# Patient Record
Sex: Female | Born: 1955 | Race: White | Hispanic: No | Marital: Married | Smoking: Never smoker
Health system: Southern US, Community
[De-identification: ages and names within clinical notes are randomized; demographics above are authoritative.]

## PROBLEM LIST (undated history)

## (undated) DIAGNOSIS — I10 Essential (primary) hypertension: Secondary | ICD-10-CM

## (undated) DIAGNOSIS — K219 Gastro-esophageal reflux disease without esophagitis: Secondary | ICD-10-CM

## (undated) DIAGNOSIS — Z789 Other specified health status: Secondary | ICD-10-CM

## (undated) HISTORY — PX: TONSILLECTOMY: SUR1361

## (undated) HISTORY — DX: Essential (primary) hypertension: I10

## (undated) HISTORY — PX: APPENDECTOMY: SHX54

---

## 1975-11-18 HISTORY — PX: WRIST MASS EXCISION: SHX2674

## 1980-11-17 HISTORY — PX: ECTOPIC PREGNANCY SURGERY: SHX613

## 2006-08-30 ENCOUNTER — Emergency Department (HOSPITAL_COMMUNITY): Admission: EM | Admit: 2006-08-30 | Discharge: 2006-08-30 | Payer: Self-pay | Admitting: Family Medicine

## 2011-08-28 NOTE — H&P (Signed)
NTS SOAP Note  Vital Signs:  Vitals as of: 08/28/2011: Systolic 148: Diastolic 93: Heart Rate 70: Temp 96.48F: Height 35ft 5in: Weight 184Lbs 0 Ounces: Pain Level 3: BMI 31  BMI : 30.62 kg/m2  Subjective: This 55 Years 2 Months old Female presents for of ABDOMINAL ISSUES: ,Has been having intermittent right upper quadrant abdominal pain, nausea, vomiting, and food intolerance for over a year. Seems to be getting more frequent. U/S of gallbladder revealed cholelithiasis. No fever, chills, jaundice.  Review of Symptoms:  Constitutional: unremarkable  Head: unremarkable  Eyes:unremarkable  Nose/Mouth/Throat:unremarkable  Cardiovascular:unremarkable  Respiratory: unremarkable  Gastrointestin abdominal pain,nausea,vomiting,heartburn Genitourinary: unremarkable  Musculoskeletal: unremarkable  Skin: unremarkable  Hematolgic/Lymphatic: unremarkable  Allergic/Immunologic:unremarkable    Past Medical History:Reviewed  Past Medical History  Surgical History: Appendectomy, salpingectomy Medical Problems: unremakable Allergies: nkda Medications: ambien   Social History: Reviewed   Social History  Preferred Language: English ([place]) Race: White Ethnicity: Not Hispanic / Latino Age: 4 Years 2 Months Alcohol: Yes, socially Recreational drug(s): No   Smoking Status: Never smoker reviewed on 08/28/2011  Family History: Reviewed  Family History  Father: Cancer, Hypertension Mother: Cancer, Hypertension, Diabetes Type II Sibling: Cancer, Hypertension, Diabetes Type II    Objective Information:  General: Well appearing, well nourished in no distress.  Skin:no rash or prominent lesions  Head: Atraumatic; no masses; no abnormalities  No scleral icterus Neck: Supple without lymphadenopathy.  Heart: RRR, no murmur or gallop. Normal S1, S2. No S3, S4.  Lungs:CTA bilaterally, no wheezes, rhonchi, rales. Breathing unlabored.  Abdomen: Soft, ND, normal bowel sounds,  no HSM, no masses. No peritoneal signs. Tender in right upper quadrant to palpation.   Assessment: biliary colic, cholelithiasis  Diagnosis & Procedure: DiagnosisCode: 574.10, ProcedureCode: 16109,    Plan:Scheduled for laparoscopic cholecystectomy on 09/03/11.   Patient Education: Alternative treatments to surgery were discussed with patient (and family).Risks and benefits of procedure were fully explained to the patient (and family) who gave informed consent. Patient/family questions were addressed.  Follow-up: Pending Surgery

## 2011-09-01 ENCOUNTER — Encounter (HOSPITAL_COMMUNITY)
Admission: RE | Admit: 2011-09-01 | Discharge: 2011-09-01 | Disposition: A | Discharge status: Home / Self Care | Payer: Managed Care, Other (non HMO) | Source: Ambulatory Visit | Attending: General Surgery | Admitting: General Surgery

## 2011-09-01 ENCOUNTER — Encounter (HOSPITAL_COMMUNITY): Payer: Self-pay

## 2011-09-01 HISTORY — DX: Other specified health status: Z78.9

## 2011-09-01 LAB — SURGICAL PCR SCREEN: Staphylococcus aureus: NEGATIVE

## 2011-09-01 LAB — DIFFERENTIAL
Basophils Absolute: 0 10*3/uL (ref 0.0–0.1)
Basophils Relative: 0 % (ref 0–1)
Lymphocytes Relative: 35 % (ref 12–46)
Monocytes Relative: 8 % (ref 3–12)
Neutro Abs: 4.9 10*3/uL (ref 1.7–7.7)
Neutrophils Relative %: 55 % (ref 43–77)

## 2011-09-01 LAB — HEPATIC FUNCTION PANEL
ALT: 16 U/L (ref 0–35)
AST: 15 U/L (ref 0–37)
Albumin: 3.9 g/dL (ref 3.5–5.2)

## 2011-09-01 LAB — BASIC METABOLIC PANEL
BUN: 12 mg/dL (ref 6–23)
Chloride: 103 mEq/L (ref 96–112)
GFR calc Af Amer: >90 mL/min (ref >90)
GFR calc non Af Amer: >90 mL/min (ref >90)
Potassium: 4.7 mEq/L (ref 3.5–5.1)
Sodium: 139 mEq/L (ref 135–145)

## 2011-09-01 LAB — CBC
Hemoglobin: 13.5 g/dL (ref 12.0–15.0)
MCHC: 32.8 g/dL (ref 30.0–36.0)
WBC: 8.8 10*3/uL (ref 4.0–10.5)

## 2011-09-01 NOTE — Patient Instructions (Signed)
20 NETHA DAFOE  09/01/2011   Your procedure is scheduled on: 09/05/2011  Report to Janet Fields at [phone] AM.  Call this number if you have problems the morning of surgery: [phone]   Remember:   Do not eat food:After Midnight.  Do not drink clear liquids: After Midnight.  Take these medicines the morning of surgery with A SIP OF WATER: no meds   Do not wear jewelry, make-up or nail polish.  Do not wear lotions, powders, or perfumes. You may wear deodorant.  Do not shave 48 hours prior to surgery.  Do not bring valuables to the hospital.  Contacts, dentures or bridgework may not be worn into surgery.  Leave suitcase in the car. After surgery it may be brought to your room.  For patients admitted to the hospital, checkout time is 11:00 AM the day of discharge.   Patients discharged the day of surgery will not be allowed to drive home.  Name and phone number of your driver: spouse  Special Instructions: CHG Shower Use Special Wash: 1/2 bottle night before surgery and 1/2 bottle morning of surgery.   Please read over the following fact sheets that you were given: Pain Booklet and Anesthesia Post-op Instructions

## 2011-09-05 ENCOUNTER — Encounter (HOSPITAL_COMMUNITY)
Admission: RE | Disposition: A | Discharge status: Home / Self Care | Payer: Self-pay | Source: Ambulatory Visit | Attending: General Surgery

## 2011-09-05 ENCOUNTER — Encounter (HOSPITAL_COMMUNITY): Payer: Self-pay | Admitting: *Deleted

## 2011-09-05 ENCOUNTER — Encounter (HOSPITAL_COMMUNITY): Payer: Self-pay | Admitting: Anesthesiology

## 2011-09-05 ENCOUNTER — Ambulatory Visit (HOSPITAL_COMMUNITY): Payer: Managed Care, Other (non HMO) | Admitting: Anesthesiology

## 2011-09-05 ENCOUNTER — Other Ambulatory Visit: Payer: Self-pay | Admitting: General Surgery

## 2011-09-05 ENCOUNTER — Ambulatory Visit (HOSPITAL_COMMUNITY)
Admission: RE | Admit: 2011-09-05 | Discharge: 2011-09-05 | Disposition: A | Discharge status: Home / Self Care | Payer: Managed Care, Other (non HMO) | Source: Ambulatory Visit | Attending: General Surgery | Admitting: General Surgery

## 2011-09-05 DIAGNOSIS — K8010 Calculus of gallbladder with chronic cholecystitis without obstruction: Secondary | ICD-10-CM | POA: Insufficient documentation

## 2011-09-05 DIAGNOSIS — Z01812 Encounter for preprocedural laboratory examination: Secondary | ICD-10-CM | POA: Insufficient documentation

## 2011-09-05 HISTORY — PX: CHOLECYSTECTOMY: SHX55

## 2011-09-05 SURGERY — LAPAROSCOPIC CHOLECYSTECTOMY
Anesthesia: General | Site: Abdomen | Wound class: Contaminated

## 2011-09-05 MED ORDER — BUPIVACAINE HCL 0.5 % IJ SOLN
INTRAMUSCULAR | Status: DC | PRN
  Administered 2011-09-05: 10 mL

## 2011-09-05 MED ORDER — FENTANYL CITRATE 0.05 MG/ML IJ SOLN: 25.0000 ug | INTRAMUSCULAR | Status: DC | PRN

## 2011-09-05 MED ORDER — ACETAMINOPHEN 325 MG PO TABS: 325.0000 mg | ORAL_TABLET | ORAL | Status: DC | PRN

## 2011-09-05 MED ORDER — FENTANYL CITRATE 0.05 MG/ML IJ SOLN
INTRAMUSCULAR | Status: AC
  Filled 2011-09-05: qty 5

## 2011-09-05 MED ORDER — ENOXAPARIN SODIUM 40 MG/0.4ML ~~LOC~~ SOLN
40.0000 mg | Freq: Once | SUBCUTANEOUS | Status: AC
  Administered 2011-09-05: 40 mg via SUBCUTANEOUS

## 2011-09-05 MED ORDER — ONDANSETRON HCL 4 MG/2ML IJ SOLN
4.0000 mg | Freq: Once | INTRAMUSCULAR | Status: AC
  Administered 2011-09-05: 4 mg via INTRAVENOUS

## 2011-09-05 MED ORDER — KETOROLAC TROMETHAMINE 30 MG/ML IJ SOLN
INTRAMUSCULAR | Status: AC
  Filled 2011-09-05: qty 1

## 2011-09-05 MED ORDER — GLYCOPYRROLATE 0.2 MG/ML IJ SOLN
INTRAMUSCULAR | Status: AC
  Filled 2011-09-05: qty 1

## 2011-09-05 MED ORDER — OXYCODONE-ACETAMINOPHEN 7.5-325 MG PO TABS: 1.0000 | ORAL_TABLET | ORAL | Status: AC | PRN

## 2011-09-05 MED ORDER — SODIUM CHLORIDE 0.9 % IR SOLN
Status: DC | PRN
  Administered 2011-09-05: 1000 mL

## 2011-09-05 MED ORDER — LACTATED RINGERS IV SOLN
INTRAVENOUS | Status: DC
  Administered 2011-09-05: 1000 mL via INTRAVENOUS

## 2011-09-05 MED ORDER — CEFAZOLIN SODIUM 1-5 GM-% IV SOLN: 1.0000 g | INTRAVENOUS | Status: DC

## 2011-09-05 MED ORDER — NEOSTIGMINE METHYLSULFATE 1 MG/ML IJ SOLN
INTRAMUSCULAR | Status: AC
  Filled 2011-09-05: qty 10

## 2011-09-05 MED ORDER — GLYCOPYRROLATE 0.2 MG/ML IJ SOLN
0.2000 mg | Freq: Once | INTRAMUSCULAR | Status: AC | PRN
  Administered 2011-09-05: 0.2 mg via INTRAVENOUS

## 2011-09-05 MED ORDER — PROPOFOL 10 MG/ML IV EMUL
INTRAVENOUS | Status: DC | PRN
  Administered 2011-09-05: 125 mg via INTRAVENOUS

## 2011-09-05 MED ORDER — GLYCOPYRROLATE 0.2 MG/ML IJ SOLN
INTRAMUSCULAR | Status: DC | PRN
  Administered 2011-09-05 (×2): 0.2 mg via INTRAVENOUS

## 2011-09-05 MED ORDER — CEFAZOLIN SODIUM 1-5 GM-% IV SOLN
INTRAVENOUS | Status: AC
  Administered 2011-09-05: 1 g via INTRAVENOUS
  Filled 2011-09-05: qty 50

## 2011-09-05 MED ORDER — FENTANYL CITRATE 0.05 MG/ML IJ SOLN
INTRAMUSCULAR | Status: DC | PRN
  Administered 2011-09-05 (×8): 50 ug via INTRAVENOUS

## 2011-09-05 MED ORDER — GLYCOPYRROLATE 0.2 MG/ML IJ SOLN
INTRAMUSCULAR | Status: AC
  Administered 2011-09-05: 0.2 mg via INTRAVENOUS
  Filled 2011-09-05: qty 1

## 2011-09-05 MED ORDER — MIDAZOLAM HCL 2 MG/2ML IJ SOLN
1.0000 mg | INTRAMUSCULAR | Status: DC | PRN
  Administered 2011-09-05: 2 mg via INTRAVENOUS

## 2011-09-05 MED ORDER — ONDANSETRON HCL 4 MG/2ML IJ SOLN: 4.0000 mg | Freq: Once | INTRAMUSCULAR | Status: DC | PRN

## 2011-09-05 MED ORDER — PROPOFOL 10 MG/ML IV EMUL
INTRAVENOUS | Status: AC
  Filled 2011-09-05: qty 20

## 2011-09-05 MED ORDER — ROCURONIUM BROMIDE 100 MG/10ML IV SOLN
INTRAVENOUS | Status: DC | PRN
  Administered 2011-09-05: 5 mg via INTRAVENOUS
  Administered 2011-09-05: 25 mg via INTRAVENOUS

## 2011-09-05 MED ORDER — FENTANYL CITRATE 0.05 MG/ML IJ SOLN
INTRAMUSCULAR | Status: AC
  Filled 2011-09-05: qty 2

## 2011-09-05 MED ORDER — LIDOCAINE HCL (PF) 1 % IJ SOLN
INTRAMUSCULAR | Status: AC
  Filled 2011-09-05: qty 5

## 2011-09-05 MED ORDER — ROCURONIUM BROMIDE 50 MG/5ML IV SOLN
INTRAVENOUS | Status: AC
  Filled 2011-09-05: qty 1

## 2011-09-05 MED ORDER — HEMOSTATIC AGENTS (NO CHARGE) OPTIME
TOPICAL | Status: DC | PRN
  Administered 2011-09-05: 1 via TOPICAL

## 2011-09-05 MED ORDER — BUPIVACAINE HCL (PF) 0.5 % IJ SOLN
INTRAMUSCULAR | Status: AC
  Filled 2011-09-05: qty 30

## 2011-09-05 MED ORDER — NEOSTIGMINE METHYLSULFATE 1 MG/ML IJ SOLN
INTRAMUSCULAR | Status: DC | PRN
  Administered 2011-09-05 (×2): 1.25 mg via INTRAVENOUS

## 2011-09-05 MED ORDER — ONDANSETRON HCL 4 MG/2ML IJ SOLN
INTRAMUSCULAR | Status: AC
  Administered 2011-09-05: 4 mg via INTRAVENOUS
  Filled 2011-09-05: qty 2

## 2011-09-05 MED ORDER — ENOXAPARIN SODIUM 40 MG/0.4ML ~~LOC~~ SOLN
SUBCUTANEOUS | Status: AC
  Administered 2011-09-05: 40 mg via SUBCUTANEOUS
  Filled 2011-09-05: qty 0.4

## 2011-09-05 MED ORDER — KETOROLAC TROMETHAMINE 30 MG/ML IJ SOLN
30.0000 mg | Freq: Once | INTRAMUSCULAR | Status: AC
  Administered 2011-09-05: 30 mg via INTRAVENOUS

## 2011-09-05 MED ORDER — MIDAZOLAM HCL 2 MG/2ML IJ SOLN
INTRAMUSCULAR | Status: AC
  Administered 2011-09-05: 2 mg via INTRAVENOUS
  Filled 2011-09-05: qty 2

## 2011-09-05 SURGICAL SUPPLY — 32 items
APPLIER CLIP ROT 10 11.4 M/L (STAPLE) ×2
BAG HAMPER (MISCELLANEOUS) ×2 IMPLANT
CLIP APPLIE ROT 10 11.4 M/L (STAPLE) ×1 IMPLANT
CLOTH BEACON ORANGE TIMEOUT ST (SAFETY) ×2 IMPLANT
COVER LIGHT HANDLE STERIS (MISCELLANEOUS) ×4 IMPLANT
DECANTER SPIKE VIAL GLASS SM (MISCELLANEOUS) ×2 IMPLANT
DURAPREP 26ML APPLICATOR (WOUND CARE) ×2 IMPLANT
ELECT REM PT RETURN 9FT ADLT (ELECTROSURGICAL) ×2
ELECTRODE REM PT RTRN 9FT ADLT (ELECTROSURGICAL) ×1 IMPLANT
FILTER SMOKE EVAC LAPAROSHD (FILTER) ×2 IMPLANT
FORMALIN 10 PREFIL 120ML (MISCELLANEOUS) ×2 IMPLANT
GLOVE BIO SURGEON STRL SZ7.5 (GLOVE) ×2 IMPLANT
GLOVE ECLIPSE 6.5 STRL STRAW (GLOVE) ×4 IMPLANT
GLOVE EXAM NITRILE MD LF STRL (GLOVE) ×2 IMPLANT
GLOVE INDICATOR 7.0 STRL GRN (GLOVE) ×4 IMPLANT
GOWN BRE IMP SLV AUR XL STRL (GOWN DISPOSABLE) ×6 IMPLANT
HEMOSTAT SNOW SURGICEL 2X4 (HEMOSTASIS) ×2 IMPLANT
INST SET LAPROSCOPIC AP (KITS) ×2 IMPLANT
KIT ROOM TURNOVER APOR (KITS) ×2 IMPLANT
KIT TROCAR LAP CHOLE (TROCAR) ×2 IMPLANT
MANIFOLD NEPTUNE II (INSTRUMENTS) ×2 IMPLANT
NS IRRIG 1000ML POUR BTL (IV SOLUTION) ×2 IMPLANT
PACK LAP CHOLE LZT030E (CUSTOM PROCEDURE TRAY) ×2 IMPLANT
PAD ARMBOARD 7.5X6 YLW CONV (MISCELLANEOUS) ×2 IMPLANT
POUCH SPECIMEN RETRIEVAL 10MM (ENDOMECHANICALS) ×2 IMPLANT
SET BASIN LINEN APH (SET/KITS/TRAYS/PACK) ×2 IMPLANT
SPONGE GAUZE 2X2 8PLY STRL LF (GAUZE/BANDAGES/DRESSINGS) ×2 IMPLANT
STAPLER VISISTAT (STAPLE) ×2 IMPLANT
SUT VICRYL 0 UR6 27IN ABS (SUTURE) ×2 IMPLANT
TAPE CLOTH SURG 4X10 WHT LF (GAUZE/BANDAGES/DRESSINGS) ×2 IMPLANT
WARMER LAPAROSCOPE (MISCELLANEOUS) ×2 IMPLANT
YANKAUER SUCT 12FT TUBE ARGYLE (SUCTIONS) ×2 IMPLANT

## 2011-09-05 NOTE — Anesthesia Postprocedure Evaluation (Signed)
  Anesthesia Post-op Note  Patient: Janet Fields  Procedure(s) Performed:  LAPAROSCOPIC CHOLECYSTECTOMY  Patient Location: PACU  Anesthesia Type: General  Level of Consciousness: awake, alert  and oriented  Airway and Oxygen Therapy: Patient Spontanous Breathing and Patient connected to face mask oxygen  Post-op Pain: mild  Post-op Assessment: Post-op Vital signs reviewed, Patient's Cardiovascular Status Stable and Respiratory Function Stable  Post-op Vital Signs: Reviewed  Complications: No apparent anesthesia complications

## 2011-09-05 NOTE — Op Note (Signed)
Patient:  Janet Fields  DOB:  1956-04-28  MRN:  [id number]   Preop Diagnosis:  Cholecystitis, cholelithiasis  Postop Diagnosis:  Same  Procedure:  Laparoscopic cholecystectomy  Surgeon:  Franky Macho, M.D.  Anes:  General endotracheal  Indications:  Patient is a 55 year old white female presents with biliary colic secondary to cholelithiasis. The risks and benefits of the procedure including bleeding, infection, hepatobiliary injury, the possibly of an open procedure were fully explained to the patient, gave informed consent.  Procedure note:  Patient is placed the supine position. After induction of general endotracheal anesthesia, the abdomen was prepped and draped using usual sterile technique with DuraPrep. Surgical site confirmation was performed.  A supraumbilical incision was made down to the fascia. A Veress needle was introduced into the abdominal cavity and confirmation of placement was done using the saline drop test. The abdomen was then insufflated to 16 mm mercury pressure. An 11 mm trocar was introduced into the abdominal cavity under direct visualization without difficulty. The patient was placed in reverse Trendelenburg position and additional 11 mm trocar was placed the epigastric region 5 mm trochars were placed the right upper quadrant right flank regions. Liver was inspected and noted within normal limits. The gallbladder was retracted superior laterally. Dissection was begun around the infundibulum of the gallbladder. The cystic duct was first identified. Junction to the infundibulum flow identified. Endoclips placed proximally distally on the cystic duct and cystic duct was divided. This is likewise done cystic artery. The gallbladder was then freed away from the gallbladder fossa using Bovie electrocautery. The gallbladder was delivered through the epigastric trocar site using an Endo Catch bag. The gallbladder fossa was inspected no abnormal bleeding or bile leakage was  noted. Surgicel is placed the gallbladder fossa. All fluid and air were then evacuated from the abdominal cavity prior to removal of the trochars.  All wounds were gave normal saline. All wounds were injected with 0.5% Sensorcaine. The supraumbilical fascia is reapproximated using 0 Vicryl interrupted suture. All skin incisions were closed using staples. Betadine ointment dry sterile dressings were applied.  All tape and needle counts were correct the end of the procedure. Patient was extubated in the operating room went back to PACU in stable condition.  Complications:  None  EBL:  Minimal  Specimen:  Gallbladder

## 2011-09-05 NOTE — Anesthesia Procedure Notes (Addendum)
Procedure Name: Intubation Date/Time: 09/05/2011 9:24 AM Performed by: Marylene Buerger Pre-anesthesia Checklist: Patient identified Patient Re-evaluated:Patient Re-evaluated prior to inductionOxygen Delivery Method: Circle System Utilized Preoxygenation: Pre-oxygenation with 100% oxygen   Date/Time: 09/05/2011 9:31 AM Performed by: Marylene Buerger Intubation Type: IV induction Ventilation: Mask ventilation without difficulty Laryngoscope Size: Mac and 3 Grade View: Grade I Tube type: Oral Tube size: 7.0 mm Number of attempts: 1 Placement Confirmation: ETT inserted through vocal cords under direct vision,  breath sounds checked- equal and bilateral,  positive ETCO2 and CO2 detector Secured at: 21 cm Tube secured with: Tape Dental Injury: Teeth and Oropharynx as per pre-operative assessment

## 2011-09-05 NOTE — Interval H&P Note (Signed)
History and Physical Interval Note:   09/05/2011   7:48 AM   Janet Fields  has presented today for surgery, with the diagnosis of Calculus of gallbladder with other cholecystitis, without mention of obstruction [574.10]  The various methods of treatment have been discussed with the patient and family. After consideration of risks, benefits and other options for treatment, the patient has consented to  Procedure(s): LAPAROSCOPIC CHOLECYSTECTOMY as a surgical intervention .  I have reviewed the patients' chart and labs.  I examined the patient.  Questions were answered to the patient's satisfaction.     Dalia Heading  MD

## 2011-09-05 NOTE — Transfer of Care (Signed)
Immediate Anesthesia Transfer of Care Note  Patient: Janet Fields  Procedure(s) Performed:  LAPAROSCOPIC CHOLECYSTECTOMY  Patient Location: PACU  Anesthesia Type: General  Level of Consciousness: awake, alert  and oriented  Airway & Oxygen Therapy: Patient Spontanous Breathing and Patient connected to face mask oxygen  Post-op Assessment: Report given to PACU RN, Post -op Vital signs reviewed and stable and Patient moving all extremities  Post vital signs: Reviewed  Complications: No apparent anesthesia complications

## 2011-09-05 NOTE — Preoperative (Signed)
Beta Blockers   Reason not to administer Beta Blockers:Not Applicable 

## 2011-09-05 NOTE — Anesthesia Preprocedure Evaluation (Signed)
Anesthesia Evaluation  Name, MR# and DOB Patient awake  General Assessment Comment  Reviewed: Allergy & Precautions, H&P , NPO status , Patient's Chart, lab work & pertinent test results  Airway Mallampati: I TM Distance: >3 FB Neck ROM: Full    Dental No notable dental hx.    Pulmonary    Pulmonary exam normal       Cardiovascular Regular Normal    Neuro/Psych    GI/Hepatic negative GI ROS Neg liver ROS    Endo/Other  Negative Endocrine ROS  Renal/GU negative Renal ROS     Musculoskeletal negative musculoskeletal ROS (+)   Abdominal Normal abdominal exam  (+)   Peds  Hematology negative hematology ROS (+)   Anesthesia Other Findings   Reproductive/Obstetrics negative OB ROS                           Anesthesia Physical Anesthesia Plan  ASA: I  Anesthesia Plan: General   Post-op Pain Management:    Induction: Intravenous  Airway Management Planned: Oral ETT  Additional Equipment:   Intra-op Plan:   Post-operative Plan: Extubation in OR  Informed Consent: I have reviewed the patients History and Physical, chart, labs and discussed the procedure including the risks, benefits and alternatives for the proposed anesthesia with the patient or authorized representative who has indicated his/her understanding and acceptance.     Plan Discussed with: CRNA  Anesthesia Plan Comments:         Anesthesia Quick Evaluation

## 2011-09-12 ENCOUNTER — Encounter (HOSPITAL_COMMUNITY): Payer: Self-pay | Admitting: General Surgery

## 2012-09-09 ENCOUNTER — Other Ambulatory Visit (HOSPITAL_COMMUNITY): Payer: Self-pay | Admitting: Family Medicine

## 2012-09-09 DIAGNOSIS — Z139 Encounter for screening, unspecified: Secondary | ICD-10-CM

## 2012-09-17 ENCOUNTER — Ambulatory Visit (HOSPITAL_COMMUNITY)
Admission: RE | Admit: 2012-09-17 | Discharge: 2012-09-17 | Disposition: A | Discharge status: Home / Self Care | Payer: Managed Care, Other (non HMO) | Source: Ambulatory Visit | Attending: Family Medicine | Admitting: Family Medicine

## 2012-09-17 DIAGNOSIS — Z1231 Encounter for screening mammogram for malignant neoplasm of breast: Secondary | ICD-10-CM | POA: Insufficient documentation

## 2012-09-17 DIAGNOSIS — Z139 Encounter for screening, unspecified: Secondary | ICD-10-CM

## 2013-08-29 ENCOUNTER — Other Ambulatory Visit (HOSPITAL_COMMUNITY): Payer: Self-pay | Admitting: Family Medicine

## 2013-08-29 DIAGNOSIS — Z139 Encounter for screening, unspecified: Secondary | ICD-10-CM

## 2013-09-22 ENCOUNTER — Ambulatory Visit (HOSPITAL_COMMUNITY): Payer: Managed Care, Other (non HMO)

## 2013-09-27 ENCOUNTER — Ambulatory Visit (HOSPITAL_COMMUNITY)
Admission: RE | Admit: 2013-09-27 | Discharge: 2013-09-27 | Disposition: A | Discharge status: Home / Self Care | Payer: Managed Care, Other (non HMO) | Source: Ambulatory Visit | Attending: Family Medicine | Admitting: Family Medicine

## 2013-09-27 DIAGNOSIS — Z1231 Encounter for screening mammogram for malignant neoplasm of breast: Secondary | ICD-10-CM | POA: Insufficient documentation

## 2013-09-27 DIAGNOSIS — Z139 Encounter for screening, unspecified: Secondary | ICD-10-CM

## 2014-08-03 ENCOUNTER — Encounter (INDEPENDENT_AMBULATORY_CARE_PROVIDER_SITE_OTHER): Payer: Self-pay | Admitting: *Deleted

## 2014-08-28 ENCOUNTER — Ambulatory Visit (INDEPENDENT_AMBULATORY_CARE_PROVIDER_SITE_OTHER): Payer: Managed Care, Other (non HMO) | Admitting: Internal Medicine

## 2014-09-13 ENCOUNTER — Other Ambulatory Visit (HOSPITAL_COMMUNITY): Payer: Self-pay | Admitting: Family Medicine

## 2014-09-13 DIAGNOSIS — Z1231 Encounter for screening mammogram for malignant neoplasm of breast: Secondary | ICD-10-CM

## 2014-10-09 ENCOUNTER — Ambulatory Visit (HOSPITAL_COMMUNITY): Payer: Managed Care, Other (non HMO)

## 2015-06-18 ENCOUNTER — Other Ambulatory Visit: Payer: Self-pay | Admitting: Family Medicine

## 2015-06-18 DIAGNOSIS — Z1231 Encounter for screening mammogram for malignant neoplasm of breast: Secondary | ICD-10-CM

## 2015-06-20 ENCOUNTER — Ambulatory Visit
Admission: RE | Admit: 2015-06-20 | Discharge: 2015-06-20 | Disposition: A | Discharge status: Home / Self Care | Payer: Managed Care, Other (non HMO) | Source: Ambulatory Visit | Attending: Family Medicine | Admitting: Family Medicine

## 2015-06-20 DIAGNOSIS — Z1231 Encounter for screening mammogram for malignant neoplasm of breast: Secondary | ICD-10-CM

## 2016-02-06 ENCOUNTER — Encounter (INDEPENDENT_AMBULATORY_CARE_PROVIDER_SITE_OTHER): Payer: Self-pay

## 2016-03-06 ENCOUNTER — Encounter (INDEPENDENT_AMBULATORY_CARE_PROVIDER_SITE_OTHER): Payer: Self-pay | Admitting: *Deleted

## 2016-03-28 ENCOUNTER — Ambulatory Visit (INDEPENDENT_AMBULATORY_CARE_PROVIDER_SITE_OTHER): Payer: Managed Care, Other (non HMO) | Admitting: Internal Medicine

## 2016-03-28 ENCOUNTER — Other Ambulatory Visit (INDEPENDENT_AMBULATORY_CARE_PROVIDER_SITE_OTHER): Payer: Self-pay | Admitting: *Deleted

## 2016-03-28 ENCOUNTER — Other Ambulatory Visit (INDEPENDENT_AMBULATORY_CARE_PROVIDER_SITE_OTHER): Payer: Self-pay | Admitting: Internal Medicine

## 2016-03-28 ENCOUNTER — Encounter (INDEPENDENT_AMBULATORY_CARE_PROVIDER_SITE_OTHER): Payer: Self-pay | Admitting: *Deleted

## 2016-03-28 ENCOUNTER — Encounter (INDEPENDENT_AMBULATORY_CARE_PROVIDER_SITE_OTHER): Payer: Self-pay | Admitting: Internal Medicine

## 2016-03-28 VITALS — BP 132/72 | HR 72 | Temp 98.0°F | Ht 64.0 in | Wt 208.5 lb

## 2016-03-28 DIAGNOSIS — R197 Diarrhea, unspecified: Secondary | ICD-10-CM | POA: Diagnosis not present

## 2016-03-28 DIAGNOSIS — I10 Essential (primary) hypertension: Secondary | ICD-10-CM | POA: Insufficient documentation

## 2016-03-28 MED ORDER — DICYCLOMINE HCL 10 MG PO CAPS: 10.0000 mg | ORAL_CAPSULE | Freq: Three times a day (TID) | ORAL | Status: DC

## 2016-03-28 NOTE — Patient Instructions (Addendum)
Rx for Dicyclomine tid Colonoscopy: The risks and benefits such as perforation, bleeding, and infection were reviewed with the patient and is agreeable. Fiber 4 gms po

## 2016-03-28 NOTE — Telephone Encounter (Signed)
Patient needs trilyte 

## 2016-03-28 NOTE — Progress Notes (Signed)
   Subjective:    Patient ID: Janet Fields, female    DOB: 01/15/1956, 60 y.o.   MRN: [id number]  HPI Referred by Dr. Dimas AguasHoward for diarrhea. She has tried a Horticulturist, commercialdairy free diet which has not helped. Some foods cause diarrhea and some don't.  Sometimes she is incontinent (She doesn't make it to the bathroom).  Symptoms x 3 years. Recently on Tamiflu and Prednisone. No recent antibiotics.  He last colonoscopy was in  2009 and was normal except for moderate diverticula in the sigmoid colon. No family hx of colon cancer. Appetite is good. No weight loss.  Has a BM on average x 4 a day. Always loose. She has urgency.  Denies stress. No NSAIDS     Review of Systems Past Medical History  Diagnosis Date  . No pertinent past medical history   . Hypertension     Past Surgical History  Procedure Laterality Date  . Tonsillectomy    . Appendectomy    . Ectopic pregnancy surgery  1982  . Wrist mass excision  1977  . Cholecystectomy  09/05/2011    Procedure: LAPAROSCOPIC CHOLECYSTECTOMY;  Surgeon: Dalia HeadingMark A Jenkins;  Location: [facility];  Service: General;  Laterality: N/A;    No Known Allergies  Current Outpatient Prescriptions on File Prior to Visit  Medication Sig Dispense Refill  . zolpidem (AMBIEN) 10 MG tablet Take 10 mg by mouth at bedtime.       No current facility-administered medications on file prior to visit.        Objective:   Physical Exam Blood pressure 132/72, pulse 72, temperature 98 F (36.7 C), height 5\' 4"  (1.626 m), weight 208 lb 8 oz (94.575 kg). Alert and oriented. Skin warm and dry. Oral mucosa is moist.   . Sclera anicteric, conjunctivae is pink. Thyroid not enlarged. No cervical lymphadenopathy. Lungs clear. Heart regular rate and rhythm.  Abdomen is soft. Bowel sounds are positive. No hepatomegaly. No abdominal masses felt. No tenderness.  No edema to lower extremities. Stool brown and guaiac negative.  Lot [id number] Ex 9/17    Assessment & Plan:  Chronic  diarrhea. Colonoscopy to rule out colonic neoplasm or microscopic colitis. Will get stool studies for competeness

## 2016-03-31 LAB — GASTROINTESTINAL PATHOGEN PANEL PCR
C. difficile Tox A/B, PCR: NOT DETECTED
CRYPTOSPORIDIUM, PCR: NOT DETECTED
Campylobacter, PCR: NOT DETECTED
E COLI (ETEC) LT/ST, PCR: NOT DETECTED
E COLI (STEC) STX1/STX2, PCR: NOT DETECTED
E coli 0157, PCR: NOT DETECTED
Giardia lamblia, PCR: NOT DETECTED
NOROVIRUS, PCR: NOT DETECTED
ROTAVIRUS, PCR: NOT DETECTED
SALMONELLA, PCR: NOT DETECTED
Shigella, PCR: NOT DETECTED

## 2016-03-31 MED ORDER — PEG 3350-KCL-NA BICARB-NACL 420 G PO SOLR: 4000.0000 mL | Freq: Once | ORAL | Status: DC

## 2016-04-10 ENCOUNTER — Encounter (HOSPITAL_COMMUNITY): Payer: Self-pay | Admitting: *Deleted

## 2016-04-10 ENCOUNTER — Encounter (HOSPITAL_COMMUNITY)
Admission: RE | Disposition: A | Discharge status: Home / Self Care | Payer: Self-pay | Source: Ambulatory Visit | Attending: Internal Medicine

## 2016-04-10 ENCOUNTER — Ambulatory Visit (HOSPITAL_COMMUNITY)
Admission: RE | Admit: 2016-04-10 | Discharge: 2016-04-10 | Disposition: A | Discharge status: Home / Self Care | Payer: Managed Care, Other (non HMO) | Source: Ambulatory Visit | Attending: Internal Medicine | Admitting: Internal Medicine

## 2016-04-10 DIAGNOSIS — R197 Diarrhea, unspecified: Secondary | ICD-10-CM | POA: Diagnosis present

## 2016-04-10 DIAGNOSIS — K219 Gastro-esophageal reflux disease without esophagitis: Secondary | ICD-10-CM | POA: Diagnosis not present

## 2016-04-10 DIAGNOSIS — K5730 Diverticulosis of large intestine without perforation or abscess without bleeding: Secondary | ICD-10-CM | POA: Insufficient documentation

## 2016-04-10 DIAGNOSIS — I10 Essential (primary) hypertension: Secondary | ICD-10-CM | POA: Diagnosis not present

## 2016-04-10 DIAGNOSIS — K529 Noninfective gastroenteritis and colitis, unspecified: Secondary | ICD-10-CM | POA: Diagnosis not present

## 2016-04-10 DIAGNOSIS — Z79899 Other long term (current) drug therapy: Secondary | ICD-10-CM | POA: Diagnosis not present

## 2016-04-10 HISTORY — DX: Gastro-esophageal reflux disease without esophagitis: K21.9

## 2016-04-10 HISTORY — PX: BIOPSY: SHX5522

## 2016-04-10 HISTORY — PX: COLONOSCOPY: SHX5424

## 2016-04-10 SURGERY — COLONOSCOPY
Anesthesia: Moderate Sedation

## 2016-04-10 MED ORDER — BENEFIBER DRINK MIX PO PACK: 4.0000 g | PACK | Freq: Every day | ORAL | Status: AC

## 2016-04-10 MED ORDER — MEPERIDINE HCL 50 MG/ML IJ SOLN
INTRAMUSCULAR | Status: AC
  Filled 2016-04-10: qty 1

## 2016-04-10 MED ORDER — STERILE WATER FOR IRRIGATION IR SOLN
Status: DC | PRN
  Administered 2016-04-10: 2.5 mL

## 2016-04-10 MED ORDER — MIDAZOLAM HCL 5 MG/5ML IJ SOLN
INTRAMUSCULAR | Status: DC | PRN
  Administered 2016-04-10: 2 mg via INTRAVENOUS
  Administered 2016-04-10: 1 mg via INTRAVENOUS
  Administered 2016-04-10: 2 mg via INTRAVENOUS
  Administered 2016-04-10: 3 mg via INTRAVENOUS
  Administered 2016-04-10: 2 mg via INTRAVENOUS

## 2016-04-10 MED ORDER — SODIUM CHLORIDE 0.9 % IV SOLN
INTRAVENOUS | Status: DC
  Administered 2016-04-10: 09:00:00 via INTRAVENOUS

## 2016-04-10 MED ORDER — MIDAZOLAM HCL 5 MG/5ML IJ SOLN
INTRAMUSCULAR | Status: AC
  Filled 2016-04-10: qty 10

## 2016-04-10 MED ORDER — MEPERIDINE HCL 50 MG/ML IJ SOLN
INTRAMUSCULAR | Status: DC | PRN
  Administered 2016-04-10 (×2): 25 mg via INTRAVENOUS

## 2016-04-10 NOTE — H&P (Signed)
Janet Fields is an 60 y.o. female.   Chief Complaint: Patient is here for colonoscopy. HPI: Patient is 60 year old Caucasian female who presents with 3 year history of nonbloody diarrhea. Prior to onset of diarrhea. Bowels move regularly. She has 5-6 stools per day. Most of her bowel movements occur after meals. She also has nocturnal diarrhea and has a few accidents. She denies abdominal pain or weight loss. Stool studies been negative. She also went on lactose-free diet without symptomatic improvement. Last colonoscopy was in 2009 for screening purposes and was normal. Family history is negative for CRC.  Past Medical History  Diagnosis Date  . No pertinent past medical history   . Hypertension   . GERD (gastroesophageal reflux disease)     Past Surgical History  Procedure Laterality Date  . Tonsillectomy    . Appendectomy    . Ectopic pregnancy surgery  1982  . Wrist mass excision  1977  . Cholecystectomy  09/05/2011    Procedure: LAPAROSCOPIC CHOLECYSTECTOMY;  Surgeon: Dalia HeadingMark A Jenkins;  Location: [facility];  Service: General;  Laterality: N/A;    History reviewed. No pertinent family history. Social History:  reports that she has never smoked. She does not have any smokeless tobacco history on file. She reports that she drinks alcohol. She reports that she does not use illicit drugs.  Allergies: No Known Allergies  Medications Prior to Admission  Medication Sig Dispense Refill  . dicyclomine (BENTYL) 10 MG capsule Take 1 capsule (10 mg total) by mouth 3 (three) times daily before meals. 90 capsule 3  . losartan (COZAAR) 50 MG tablet Take 50 mg by mouth daily.    . pantoprazole (PROTONIX) 40 MG tablet Take 40 mg by mouth daily.    . polyethylene glycol-electrolytes (NULYTELY/GOLYTELY) 420 g solution Take 4,000 mLs by mouth once. 4000 mL 0  . zolpidem (AMBIEN) 10 MG tablet Take 10 mg by mouth at bedtime.        No results found for this or any previous visit (from the past 48  hour(s)). No results found.  ROS  Blood pressure 140/68, pulse 67, temperature 97.9 F (36.6 C), temperature source Oral, resp. rate 13, height 5\' 4"  (1.626 m), weight 208 lb (94.348 kg), SpO2 98 %. Physical Exam  Constitutional: She appears well-developed and well-nourished.  HENT:  Mouth/Throat: Oropharynx is clear and moist.  Eyes: Conjunctivae are normal. No scleral icterus.  Neck: No thyromegaly present.  Cardiovascular: Normal rate, regular rhythm and normal heart sounds.   No murmur heard. Respiratory: Effort normal and breath sounds normal.  GI: Soft. She exhibits no distension and no mass. There is no tenderness.  Musculoskeletal: She exhibits no edema.  Lymphadenopathy:    She has no cervical adenopathy.  Neurological: She is alert.  Skin: Skin is warm and dry.     Assessment/Plan Chronic diarrhea. Diagnostic colonoscopy.  Lionel DecemberNajeeb Caylah Plouff, MD 04/10/2016, 9:27 AM

## 2016-04-10 NOTE — Op Note (Signed)
[facility] Patient Name: Janet MalletCathy Fields Procedure Date: 04/10/2016 9:23 AM MRN: [id number] Date of Birth: 10-16-1956 Attending MD: Lionel DecemberNajeeb Rehman , MD CSN: [id number] Age: [age] Admit Type: Outpatient Procedure:                Colonoscopy Indications:              Chronic diarrhea Providers:                Lionel DecemberNajeeb Rehman, MD, Cynda Acresomi Myers, RN, Calton Dachaylor Lemons,                            Technician Referring MD:             Selinda FlavinKevin Howard, MD Medicines:                Meperidine 50 mg IV, Midazolam 10 mg IV Complications:            No immediate complications. Estimated Blood Loss:     Estimated blood loss was minimal. Procedure:                Pre-Anesthesia Assessment:                           - Prior to the procedure, a History and Physical                            was performed, and patient medications and                            allergies were reviewed. The patient's tolerance of                            previous anesthesia was also reviewed. The risks                            and benefits of the procedure and the sedation                            options and risks were discussed with the patient.                            All questions were answered, and informed consent                            was obtained. Prior Anticoagulants: The patient has                            taken no previous anticoagulant or antiplatelet                            agents. ASA Grade Assessment: I - A normal, healthy                            patient. After reviewing the risks and benefits,  the patient was deemed in satisfactory condition to                            undergo the procedure.                           After obtaining informed consent, the colonoscope                            was passed under direct vision. Throughout the                            procedure, the patient's blood pressure, pulse, and                            oxygen saturations  were monitored continuously. The                            [id number] ([id number]) scope was introduced through                            the anus and advanced to the the terminal ileum.                            The colonoscopy was performed without difficulty.                            The patient tolerated the procedure well. The                            quality of the bowel preparation was good. The                            terminal ileum, ileocecal valve, appendiceal                            orifice, and rectum were photographed. Scope In: 9:40:33 AM Scope Out: 10:00:55 AM Scope Withdrawal Time: 0 hours 10 minutes 12 seconds  Total Procedure Duration: 0 hours 20 minutes 22 seconds  Findings:      The terminal ileum appeared normal.      Multiple medium-mouthed diverticula were found in the sigmoid colon.      The exam was otherwise normal throughout the examined colon.      Biopsies were taken with a cold forceps for histology.      The retroflexed view of the distal rectum and anal verge was normal and       showed no anal or rectal abnormalities. Impression:               - The examined portion of the ileum was normal.                           - Diverticulosis in the sigmoid colon.                           -  No evidence of Endoscopic colitis. Random                            biopsies taken from mucosa of sigmoid colon. Moderate Sedation:      Moderate (conscious) sedation was administered by the endoscopy nurse       and supervised by the endoscopist. The following parameters were       monitored: oxygen saturation, heart rate, blood pressure, CO2       capnography and response to care. Total physician intraservice time was       26 minutes. Recommendation:           - Patient has a contact number available for                            emergencies. The signs and symptoms of potential                            delayed complications were discussed with the                             patient. Return to normal activities tomorrow.                            Written discharge instructions were provided to the                            patient.                           - High fiber diet today.                           - Use Benefiber 4 g by mouth daily at bedtime PO                            daily daily.                           - Await pathology results.                           - Repeat colonoscopy in 10 years for screening                            purposes.                           - Continue present medications. Procedure Code(s):        --- Professional ---                           [phone], Colonoscopy, flexible; with biopsy, single                            or multiple  81191, Moderate sedation services provided by the                            same physician or other qualified health care                            professional performing the diagnostic or                            therapeutic service that the sedation supports,                            requiring the presence of an independent trained                            observer to assist in the monitoring of the                            patient's level of consciousness and physiological                            status; initial 15 minutes of intraservice time,                            patient age 41 years or older                           [phone], Moderate sedation services; each additional                            15 minutes intraservice time Diagnosis Code(s):        --- Professional ---                           K52.9, Noninfective gastroenteritis and colitis,                            unspecified                           K57.30, Diverticulosis of large intestine without                            perforation or abscess without bleeding CPT copyright 2016 [employer]. All rights reserved. The codes documented in this report  are preliminary and upon coder review may  be revised to meet current compliance requirements. Lionel December, MD Lionel December, MD 04/10/2016 10:15:27 AM This report has been signed electronically. Number of Addenda: 0

## 2016-04-10 NOTE — Discharge Instructions (Signed)
Resume usual medications and high fiber diet. Benefiber or equivalent 4 g by mouth daily at bedtime. No driving for 24 hours. Physician will call with biopsy results. Please keep stool diary for the next 2 weeks as to frequency and consistency of stools.        Colonoscopy, Care After These instructions give you information on caring for yourself after your procedure. Your doctor may also give you more specific instructions. Call your doctor if you have any problems or questions after your procedure. HOME CARE  Do not drive for 24 hours.  Do not sign important papers or use machinery for 24 hours.  You may shower.  You may go back to your usual activities, but go slower for the first 24 hours.  Take rest breaks often during the first 24 hours.  Walk around or use warm packs on your belly (abdomen) if you have belly cramping or gas.  Drink enough fluids to keep your pee (urine) clear or pale yellow.  Resume your normal diet. Avoid heavy or fried foods.  Avoid drinking alcohol for 24 hours or as told by your doctor.  Only take medicines as told by your doctor. If a tissue sample (biopsy) was taken during the procedure:   Do not take aspirin or blood thinners for 7 days, or as told by your doctor.  Do not drink alcohol for 7 days, or as told by your doctor.  Eat soft foods for the first 24 hours. GET HELP IF: You still have a small amount of blood in your poop (stool) 2-3 days after the procedure. GET HELP RIGHT AWAY IF:  You have more than a small amount of blood in your poop.  You see clumps of tissue (blood clots) in your poop.  Your belly is puffy (swollen).  You feel sick to your stomach (nauseous) or throw up (vomit).  You have a fever.  You have belly pain that gets worse and medicine does not help. MAKE SURE YOU:  Understand these instructions.  Will watch your condition.  Will get help right away if you are not doing well or get worse.   This  information is not intended to replace advice given to you by your health care provider. Make sure you discuss any questions you have with your health care provider.   Document Released: 12/06/2010 Document Revised: 11/08/2013 Document Reviewed: 07/11/2013 Elsevier Interactive Patient Education [employer]    Diverticulosis Diverticulosis is the condition that develops when small pouches (diverticula) form in the wall of your colon. Your colon, or large intestine, is where water is absorbed and stool is formed. The pouches form when the inside layer of your colon pushes through weak spots in the outer layers of your colon. CAUSES  No one knows exactly what causes diverticulosis. RISK FACTORS  Being older than 50. Your risk for this condition increases with age. Diverticulosis is rare in people younger than 40 years. By age 40, almost everyone has it.  Eating a low-fiber diet.  Being frequently constipated.  Being overweight.  Not getting enough exercise.  Smoking.  Taking over-the-counter pain medicines, like aspirin and ibuprofen. SYMPTOMS  Most people with diverticulosis do not have symptoms. DIAGNOSIS  Because diverticulosis often has no symptoms, health care providers often discover the condition during an exam for other colon problems. In many cases, a health care provider will diagnose diverticulosis while using a flexible scope to examine the colon (colonoscopy). TREATMENT  If you have never developed an  infection related to diverticulosis, you may not need treatment. If you have had an infection before, treatment may include:  Eating more fruits, vegetables, and grains.  Taking a fiber supplement.  Taking a live bacteria supplement (probiotic).  Taking medicine to relax your colon. HOME CARE INSTRUCTIONS   Drink at least 6-8 glasses of water each day to prevent constipation.  Try not to strain when you have a bowel movement.  Keep all follow-up  appointments. If you have had an infection before:  Increase the fiber in your diet as directed by your health care provider or dietitian.  Take a dietary fiber supplement if your health care provider approves.  Only take medicines as directed by your health care provider. SEEK MEDICAL CARE IF:   You have abdominal pain.  You have bloating.  You have cramps.  You have not gone to the bathroom in 3 days. SEEK IMMEDIATE MEDICAL CARE IF:   Your pain gets worse.  Yourbloating becomes very bad.  You have a fever or chills, and your symptoms suddenly get worse.  You begin vomiting.  You have bowel movements that are bloody or black. MAKE SURE YOU:  Understand these instructions.  Will watch your condition.  Will get help right away if you are not doing well or get worse.   This information is not intended to replace advice given to you by your health care provider. Make sure you discuss any questions you have with your health care provider.   Document Released: 07/31/2004 Document Revised: 11/08/2013 Document Reviewed: 09/28/2013 Elsevier Interactive Patient Education 2016 Else[employer] Inc[employer]   High-Fiber Diet Fiber, also called dietary fiber, is a type of carbohydrate found in fruits, vegetables, whole grains, and beans. A high-fiber diet can have many health benefits. Your health care provider may recommend a high-fiber diet to help:  Prevent constipation. Fiber can make your bowel movements more regular.  Lower your cholesterol.  Relieve hemorrhoids, uncomplicated diverticulosis, or irritable bowel syndrome.  Prevent overeating as part of a weight-loss plan.  Prevent heart disease, type 2 diabetes, and certain cancers. WHAT IS MY PLAN? The recommended daily intake of fiber includes:  38 grams for men under age 62.  30 grams for men over age 62.  25 grams for women under age 62.  21 grams for women over age 62. You can get the recommended daily intake of  dietary fiber by eating a variety of fruits, vegetables, grains, and beans. Your health care provider may also recommend a fiber supplement if it is not possible to get enough fiber through your diet. WHAT DO I NEED TO KNOW ABOUT A HIGH-FIBER DIET?  Fiber supplements have not been widely studied for their effectiveness, so it is better to get fiber through food sources.  Always check the fiber content on thenutrition facts label of any prepackaged food. Look for foods that contain at least 5 grams of fiber per serving.  Ask your dietitian if you have questions about specific foods that are related to your condition, especially if those foods are not listed in the following section.  Increase your daily fiber consumption gradually. Increasing your intake of dietary fiber too quickly may cause bloating, cramping, or gas.  Drink plenty of water. Water helps you to digest fiber. WHAT FOODS CAN I EAT? Grains Whole-grain breads. Multigrain cereal. Oats and oatmeal. Brown rice. Barley. Bulgur wheat. Millet. Bran muffins. Popcorn. Rye wafer crackers. Vegetables Sweet potatoes. Spinach. Kale. Artichokes. Cabbage. Broccoli. Green peas. Carrots. Squash. Fruits Berries.  Pears. Apples. Oranges. Avocados. Prunes and raisins. Dried figs. Meats and Other Protein Sources Navy, kidney, pinto, and soy beans. Split peas. Lentils. Nuts and seeds. Dairy Fiber-fortified yogurt. Beverages Fiber-fortified soy milk. Fiber-fortified orange juice. Other Fiber bars. The items listed above may not be a complete list of recommended foods or beverages. Contact your dietitian for more options. WHAT FOODS ARE NOT RECOMMENDED? Grains White bread. Pasta made with refined flour. White rice. Vegetables Fried potatoes. Canned vegetables. Well-cooked vegetables.  Fruits Fruit juice. Cooked, strained fruit. Meats and Other Protein Sources Fatty cuts of meat. Fried Environmental education officer or fried fish. Dairy Milk. Yogurt. Cream  cheese. Sour cream. Beverages Soft drinks. Other Cakes and pastries. Butter and oils. The items listed above may not be a complete list of foods and beverages to avoid. Contact your dietitian for more information. WHAT ARE SOME TIPS FOR INCLUDING HIGH-FIBER FOODS IN MY DIET?  Eat a wide variety of high-fiber foods.  Make sure that half of all grains consumed each day are whole grains.  Replace breads and cereals made from refined flour or white flour with whole-grain breads and cereals.  Replace white rice with brown rice, bulgur wheat, or millet.  Start the day with a breakfast that is high in fiber, such as a cereal that contains at least 5 grams of fiber per serving.  Use beans in place of meat in soups, salads, or pasta.  Eat high-fiber snacks, such as berries, raw vegetables, nuts, or popcorn.   This information is not intended to replace advice given to you by your health care provider. Make sure you discuss any questions you have with your health care provider.   Document Released: 11/03/2005 Document Revised: 11/24/2014 Document Reviewed: 04/18/2014 Elsevier Interactive Patient Education [employer]

## 2016-04-15 ENCOUNTER — Encounter (HOSPITAL_COMMUNITY): Payer: Self-pay | Admitting: Internal Medicine

## 2016-04-16 ENCOUNTER — Telehealth (INDEPENDENT_AMBULATORY_CARE_PROVIDER_SITE_OTHER): Payer: Self-pay | Admitting: *Deleted

## 2016-04-16 DIAGNOSIS — R197 Diarrhea, unspecified: Secondary | ICD-10-CM

## 2016-04-16 NOTE — Telephone Encounter (Signed)
Per Dr.Rehman the patient will need to have labs drawn. 

## 2016-04-17 ENCOUNTER — Other Ambulatory Visit (INDEPENDENT_AMBULATORY_CARE_PROVIDER_SITE_OTHER): Payer: Self-pay | Admitting: Internal Medicine

## 2016-04-18 LAB — CELIAC PANEL 10
Endomysial Screen: NEGATIVE
Gliadin IgA: 7 Units (ref <20)
Gliadin IgG: 3 Units (ref <20)
IgA: 209 mg/dL (ref 81–463)
TISSUE TRANSGLUT AB: 1 U/mL (ref <6)
TISSUE TRANSGLUTAMINASE AB, IGA: 1 U/mL (ref <4)

## 2016-04-22 ENCOUNTER — Encounter (INDEPENDENT_AMBULATORY_CARE_PROVIDER_SITE_OTHER): Payer: Self-pay | Admitting: *Deleted

## 2016-05-27 ENCOUNTER — Ambulatory Visit (INDEPENDENT_AMBULATORY_CARE_PROVIDER_SITE_OTHER): Payer: Managed Care, Other (non HMO) | Admitting: Internal Medicine

## 2016-06-04 ENCOUNTER — Encounter (INDEPENDENT_AMBULATORY_CARE_PROVIDER_SITE_OTHER): Payer: Self-pay | Admitting: Internal Medicine

## 2016-06-04 ENCOUNTER — Ambulatory Visit (INDEPENDENT_AMBULATORY_CARE_PROVIDER_SITE_OTHER): Payer: Managed Care, Other (non HMO) | Admitting: Internal Medicine

## 2016-06-04 VITALS — BP 160/80 | HR 64 | Temp 98.1°F | Ht 64.0 in | Wt 211.6 lb

## 2016-06-04 DIAGNOSIS — K529 Noninfective gastroenteritis and colitis, unspecified: Secondary | ICD-10-CM

## 2016-06-04 NOTE — Patient Instructions (Signed)
Continue the Gummie fiber. Continue the Dicyclomine.' Imodium one a day.

## 2016-06-04 NOTE — Progress Notes (Addendum)
   Subjective:    Patient ID: Janet Fields, female    DOB: 1956/05/28, 60 y.o.   MRN: [id number]  HPI  Here today for f/u after undergoing a colonoscopy in May for chronic diarrhea. She tells me she is doing good.  She does have some days with diarrhea. She will have diarrhea 3-4 times a week. Stools are mushy.  She does have good days without diarrhea.   She says she skips days without a BM.  The dicyclomine has helped. She has increased fiber in her diet. She is taking the Gummy fibers and has gone to whole grain bread. No incontinence. Has tried Imodium for her diarrhea. Appetite is good. No weight loss. No melena or BRRB.  GI pathogen was negative.  04/10/2016 Colonoscopy: Chronic diarrhea. Biopsy negative for microscopic colitis. Celiac panel negative.  He last colonoscopy was in 2009 and was normal except for moderate diverticula in the sigmoid colon. No family hx of colon cancer.   Review of Systems Past Medical History  Diagnosis Date  . No pertinent past medical history   . Hypertension   . GERD (gastroesophageal reflux disease)     Past Surgical History  Procedure Laterality Date  . Tonsillectomy    . Appendectomy    . Ectopic pregnancy surgery  1982  . Wrist mass excision  1977  . Cholecystectomy  09/05/2011    Procedure: LAPAROSCOPIC CHOLECYSTECTOMY;  Surgeon: Dalia HeadingMark A Jenkins;  Location: [facility];  Service: General;  Laterality: N/A;  . Colonoscopy N/A 04/10/2016    Procedure: COLONOSCOPY;  Surgeon: Malissa HippoNajeeb U Rehman, MD;  Location: [facility];  Service: Endoscopy;  Laterality: N/A;  9:30  . Biopsy  04/10/2016    Procedure: BIOPSY;  Surgeon: Malissa HippoNajeeb U Rehman, MD;  Location: [facility];  Service: Endoscopy;;  Sigmoid colon biopsies    No Known Allergies  Current Outpatient Prescriptions on File Prior to Visit  Medication Sig Dispense Refill  . dicyclomine (BENTYL) 10 MG capsule Take 1 capsule (10 mg total) by mouth 3 (three) times daily before meals. (Patient  taking differently: Take 10 mg by mouth 4 (four) times daily. ) 90 capsule 3  . losartan (COZAAR) 50 MG tablet Take 50 mg by mouth daily.    . pantoprazole (PROTONIX) 40 MG tablet Take 40 mg by mouth daily.    . Wheat Dextrin (BENEFIBER DRINK MIX) PACK Take 4 g by mouth at bedtime.    Marland Kitchen. zolpidem (AMBIEN) 10 MG tablet Take 10 mg by mouth at bedtime.       No current facility-administered medications on file prior to visit.        Objective:   Physical Exam Blood pressure 160/80, pulse 64, temperature 98.1 F (36.7 C), height 5\' 4"  (1.626 m), weight 211 lb 9.6 oz (95.981 kg). Alert and oriented. Skin warm and dry. Oral mucosa is moist.   . Sclera anicteric, conjunctivae is pink. Thyroid not enlarged. No cervical lymphadenopathy. Lungs clear. Heart regular rate and rhythm.  Abdomen is soft. Bowel sounds are positive. No hepatomegaly. No abdominal masses felt. No tenderness.  No edema to lower extremities.          Assessment & Plan:  Chronic diarrhea. She is having 3-4 stools a day. Stools are mushy. She will continue the Dicyclomine 20mg  BID.  Continue Gummy fiber. Imodium one a day Samples of IBGARD given to patient. OV in 1 year.

## 2016-08-18 ENCOUNTER — Other Ambulatory Visit (HOSPITAL_COMMUNITY): Payer: Self-pay | Admitting: Family Medicine

## 2016-08-18 DIAGNOSIS — Z1239 Encounter for other screening for malignant neoplasm of breast: Secondary | ICD-10-CM

## 2016-08-19 ENCOUNTER — Other Ambulatory Visit (HOSPITAL_COMMUNITY): Payer: Self-pay | Admitting: Family Medicine

## 2016-08-19 DIAGNOSIS — Z1231 Encounter for screening mammogram for malignant neoplasm of breast: Secondary | ICD-10-CM

## 2016-08-25 ENCOUNTER — Ambulatory Visit (HOSPITAL_COMMUNITY)
Admission: RE | Admit: 2016-08-25 | Discharge: 2016-08-25 | Disposition: A | Discharge status: Home / Self Care | Payer: Managed Care, Other (non HMO) | Source: Ambulatory Visit | Attending: Family Medicine | Admitting: Family Medicine

## 2016-08-25 DIAGNOSIS — Z1231 Encounter for screening mammogram for malignant neoplasm of breast: Secondary | ICD-10-CM | POA: Diagnosis present

## 2016-11-04 ENCOUNTER — Telehealth (INDEPENDENT_AMBULATORY_CARE_PROVIDER_SITE_OTHER): Payer: Self-pay | Admitting: Internal Medicine

## 2016-11-04 DIAGNOSIS — R197 Diarrhea, unspecified: Secondary | ICD-10-CM

## 2016-11-04 MED ORDER — DICYCLOMINE HCL 10 MG PO CAPS: 10.0000 mg | ORAL_CAPSULE | Freq: Three times a day (TID) | ORAL | 3 refills | Status: DC

## 2016-11-04 NOTE — Telephone Encounter (Signed)
[facility] I have not talked with [facility]

## 2016-11-04 NOTE — Telephone Encounter (Signed)
Patient called and stated that she tried to get her prescription refilled and the pharmacy stated that you didn't approve it.  She would like to talk to you.  [phone]

## 2017-05-07 ENCOUNTER — Encounter (INDEPENDENT_AMBULATORY_CARE_PROVIDER_SITE_OTHER): Payer: Self-pay | Admitting: Internal Medicine

## 2017-05-07 ENCOUNTER — Encounter (INDEPENDENT_AMBULATORY_CARE_PROVIDER_SITE_OTHER): Payer: Self-pay

## 2017-06-04 ENCOUNTER — Ambulatory Visit (INDEPENDENT_AMBULATORY_CARE_PROVIDER_SITE_OTHER): Payer: Managed Care, Other (non HMO) | Admitting: Internal Medicine

## 2017-07-01 ENCOUNTER — Ambulatory Visit (INDEPENDENT_AMBULATORY_CARE_PROVIDER_SITE_OTHER): Payer: Managed Care, Other (non HMO) | Admitting: Internal Medicine

## 2017-08-11 ENCOUNTER — Other Ambulatory Visit (HOSPITAL_COMMUNITY): Payer: Self-pay | Admitting: Family Medicine

## 2017-08-11 DIAGNOSIS — Z1231 Encounter for screening mammogram for malignant neoplasm of breast: Secondary | ICD-10-CM

## 2017-08-27 ENCOUNTER — Ambulatory Visit (HOSPITAL_COMMUNITY): Payer: Managed Care, Other (non HMO)

## 2017-09-02 ENCOUNTER — Ambulatory Visit (HOSPITAL_COMMUNITY)
Admission: RE | Admit: 2017-09-02 | Discharge: 2017-09-02 | Disposition: A | Discharge status: Home / Self Care | Payer: Managed Care, Other (non HMO) | Source: Ambulatory Visit | Attending: Family Medicine | Admitting: Family Medicine

## 2017-09-02 DIAGNOSIS — Z1231 Encounter for screening mammogram for malignant neoplasm of breast: Secondary | ICD-10-CM | POA: Insufficient documentation

## 2017-11-13 ENCOUNTER — Other Ambulatory Visit (INDEPENDENT_AMBULATORY_CARE_PROVIDER_SITE_OTHER): Payer: Self-pay | Admitting: Internal Medicine

## 2017-11-13 DIAGNOSIS — R197 Diarrhea, unspecified: Secondary | ICD-10-CM

## 2018-01-24 ENCOUNTER — Other Ambulatory Visit (INDEPENDENT_AMBULATORY_CARE_PROVIDER_SITE_OTHER): Payer: Self-pay | Admitting: Internal Medicine

## 2018-01-24 DIAGNOSIS — R197 Diarrhea, unspecified: Secondary | ICD-10-CM

## 2018-01-25 NOTE — Telephone Encounter (Signed)
Patient needs OV.

## 2018-04-25 ENCOUNTER — Other Ambulatory Visit (INDEPENDENT_AMBULATORY_CARE_PROVIDER_SITE_OTHER): Payer: Self-pay | Admitting: Internal Medicine

## 2018-04-25 DIAGNOSIS — R197 Diarrhea, unspecified: Secondary | ICD-10-CM

## 2018-05-14 ENCOUNTER — Other Ambulatory Visit: Payer: Self-pay

## 2018-05-14 DIAGNOSIS — I83892 Varicose veins of left lower extremities with other complications: Secondary | ICD-10-CM

## 2018-07-29 ENCOUNTER — Encounter: Payer: Self-pay | Admitting: Vascular Surgery

## 2018-07-29 ENCOUNTER — Ambulatory Visit (HOSPITAL_COMMUNITY)
Admission: RE | Admit: 2018-07-29 | Discharge: 2018-07-29 | Disposition: A | Discharge status: Home / Self Care | Payer: Managed Care, Other (non HMO) | Source: Ambulatory Visit | Attending: Vascular Surgery | Admitting: Vascular Surgery

## 2018-07-29 ENCOUNTER — Ambulatory Visit: Payer: Managed Care, Other (non HMO) | Admitting: Vascular Surgery

## 2018-07-29 ENCOUNTER — Other Ambulatory Visit: Payer: Self-pay

## 2018-07-29 VITALS — BP 128/59 | HR 68 | Temp 97.6°F | Resp 18 | Ht 64.0 in | Wt 171.8 lb

## 2018-07-29 DIAGNOSIS — I868 Varicose veins of other specified sites: Secondary | ICD-10-CM

## 2018-07-29 DIAGNOSIS — I83892 Varicose veins of left lower extremities with other complications: Secondary | ICD-10-CM | POA: Diagnosis present

## 2018-07-29 DIAGNOSIS — I83813 Varicose veins of bilateral lower extremities with pain: Secondary | ICD-10-CM

## 2018-07-29 NOTE — Progress Notes (Signed)
Referring Physician: Dr Selinda Flavin Patient name: Janet Fields MRN: [id number] DOB: [birth year] Sex: female  REASON FOR CONSULT: Symptomatic varicose veins with pain  HPI: Janet Fields is a 62 y.o. female, several year history of slowly worsening very extremity's.  She has pain that occurs on the inner thighs primarily at nighttime.  But she also developed swelling aching and pain in both lower extremities from the calf down after prolonged sitting and all day at work.  This is relieved somewhat but not fully with compression stockings.  She denies any prior ulcers.  She has no history of DVT.  She does have a family history of varicose veins in her mother.  She states the varicose veins began to get worse after previous pregnancy.     Past Medical History:  Diagnosis Date  . GERD (gastroesophageal reflux disease)   . Hypertension   . No pertinent past medical history    Past Surgical History:  Procedure Laterality Date  . APPENDECTOMY    . BIOPSY  04/10/2016   Procedure: BIOPSY;  Surgeon: Malissa Hippo, MD;  [facility];  Service: Endoscopy;;  Sigmoid colon biopsies  . CHOLECYSTECTOMY  09/05/2011   Procedure: LAPAROSCOPIC CHOLECYSTECTOMY;  Surgeon: Dalia Heading;  Location: [facility];  Service: General;  Laterality: N/A;  . COLONOSCOPY N/A 04/10/2016   Procedure: COLONOSCOPY;  Surgeon: Malissa Hippo, MD;  [facility];  Service: Endoscopy;  Laterality: N/A;  9:30  . ECTOPIC PREGNANCY SURGERY  1982  . TONSILLECTOMY    . WRIST MASS EXCISION  1977    Family History  Problem Relation Age of Onset  . Varicose Veins Mother     SOCIAL HISTORY: Social History   Socioeconomic History  . Marital status: Married    Spouse name: Not on file  . Number of children: Not on file  . Years of education: Not on file  . Highest education level: Not on file  Occupational History  . Not on file  Social Needs  . Financial resource strain: Not on file  . Food  insecurity:    Worry: Not on file    Inability: Not on file  . Transportation needs:    Medical: Not on file    Non-medical: Not on file  Tobacco Use  . Smoking status: Never Smoker  . Smokeless tobacco: Never Used  Substance and Sexual Activity  . Alcohol use: Yes    Comment: occassionally  . Drug use: No  . Sexual activity: Not on file  Lifestyle  . Physical activity:    Days per week: Not on file    Minutes per session: Not on file  . Stress: Not on file  Relationships  . Social connections:    Talks on phone: Not on file    Gets together: Not on file    Attends religious service: Not on file    Active member of club or organization: Not on file    Attends meetings of clubs or organizations: Not on file    Relationship status: Not on file  . Intimate partner violence:    Fear of current or ex partner: Not on file    Emotionally abused: Not on file    Physically abused: Not on file    Forced sexual activity: Not on file  Other Topics Concern  . Not on file  Social History Narrative  . Not on file    Allergies  Allergen Reactions  .  Estrogens Hives    Current Outpatient Medications  Medication Sig Dispense Refill  . dicyclomine (BENTYL) 10 MG capsule TAKE 1 CAPSULE THREE TIMES A DAY BEFORE MEALS 270 capsule 3  . losartan (COZAAR) 50 MG tablet Take 50 mg by mouth daily.    . pantoprazole (PROTONIX) 40 MG tablet Take 40 mg by mouth daily.    . Wheat Dextrin (BENEFIBER DRINK MIX) PACK Take 4 g by mouth at bedtime.    Marland Kitchen. zolpidem (AMBIEN) 10 MG tablet Take 10 mg by mouth at bedtime.       No current facility-administered medications for this visit.     ROS:   General:  No weight loss, Fever, chills  HEENT: No recent headaches, no nasal bleeding, no visual changes, no sore throat  Neurologic: No dizziness, blackouts, seizures. No recent symptoms of stroke or mini- stroke. No recent episodes of slurred speech, or temporary blindness.  Cardiac: No recent  episodes of chest pain/pressure, no shortness of breath at rest.  No shortness of breath with exertion.  Denies history of atrial fibrillation or irregular heartbeat  Vascular: No history of rest pain in feet.  No history of claudication.  No history of non-healing ulcer, No history of DVT   Pulmonary: No home oxygen, no productive cough, no hemoptysis,  No asthma or wheezing  Musculoskeletal:  [ ]  Arthritis, [ ]  Low back pain,  [ ]  Joint pain  Hematologic:No history of hypercoagulable state.  No history of easy bleeding.  No history of anemia  Gastrointestinal: No hematochezia or melena,  No gastroesophageal reflux, no trouble swallowing  Urinary: [ ]  chronic Kidney disease, [ ]  on HD - [ ]  MWF or [ ]  TTHS, [ ]  Burning with urination, [ ]  Frequent urination, [ ]  Difficulty urinating;   Skin: No rashes  Psychological: No history of anxiety,  No history of depression   Physical Examination  Vitals:   07/29/18 1329  BP: (!) 128/59  Pulse: 68  Resp: 18  Temp: 97.6 F (36.4 C)  TempSrc: Oral  SpO2: 98%  Weight: 171 lb 12.8 oz (77.9 kg)  Height: 5\' 4"  (1.626 m)    Body mass index is 29.49 kg/m.  General:  Alert and oriented, no acute distress HEENT: Normal Neck: No bruit or JVD Pulmonary: Clear to auscultation bilaterally Cardiac: Regular Rate and Rhythm without murmur Abdomen: Soft, non-tender, non-distended, no mass Skin: No rash, large cluster varicosities along the course of the left greater saphenous on the medial aspect left leg and the posterior medial aspect of the right popliteal fossa       Extremity Pulses:  2+ radial, brachial, femoral, dorsalis pedis, posterior tibial pulses bilaterally Musculoskeletal: No deformity trace pretibial edema  Neurologic: Upper and lower extremity motor 5/5 and symmetric  DATA:  Patient had a venous duplex exam today showed reflux on the right side and the greater saphenous vein saphenofemoral junction vein diameter 5 to 9 mm  left leg had similar findings vein diameter 4 to 10 mm area I did examine the patient's vein today with the SonoSite on my own as well.  In the left leg the greater saphenous is superficial initially and is more in continuity about 7 cm above the knee on the left side.  This would be the site for cannulation if we decide to do laser ablation.  Right leg is in continuity from below the knee all the way up to the groin.  ASSESSMENT: Symptomatic varicose veins bilaterally with pain and  swelling left leg or symptoms in the right but nearly equal   PLAN: Patient was fitted today for bilateral thigh-high compression stockings 20 to 30 mm for symptomatic relief.  She will return in 3 months time to see if this improves her symptoms otherwise we would consider laser ablation pending insurance approval.  She would also need stab avulsions in the left lower extremity greater than 20 right probably 10-20  Fabienne Bruns, MD [facility] Office: [phone] Pager: [phone]

## 2018-08-03 ENCOUNTER — Other Ambulatory Visit (HOSPITAL_COMMUNITY): Payer: Self-pay | Admitting: Family Medicine

## 2018-08-03 DIAGNOSIS — Z1231 Encounter for screening mammogram for malignant neoplasm of breast: Secondary | ICD-10-CM

## 2018-09-06 ENCOUNTER — Ambulatory Visit (HOSPITAL_COMMUNITY)
Admission: RE | Admit: 2018-09-06 | Discharge: 2018-09-06 | Disposition: A | Discharge status: Home / Self Care | Payer: Managed Care, Other (non HMO) | Source: Ambulatory Visit | Attending: Family Medicine | Admitting: Family Medicine

## 2018-09-06 DIAGNOSIS — Z1231 Encounter for screening mammogram for malignant neoplasm of breast: Secondary | ICD-10-CM | POA: Diagnosis present

## 2018-10-27 ENCOUNTER — Ambulatory Visit (INDEPENDENT_AMBULATORY_CARE_PROVIDER_SITE_OTHER): Payer: Managed Care, Other (non HMO) | Admitting: Vascular Surgery

## 2018-10-27 ENCOUNTER — Other Ambulatory Visit: Payer: Self-pay

## 2018-10-27 ENCOUNTER — Encounter: Payer: Self-pay | Admitting: Vascular Surgery

## 2018-10-27 VITALS — BP 179/88 | HR 66 | Temp 98.0°F | Resp 18 | Ht 64.5 in | Wt 178.1 lb

## 2018-10-27 DIAGNOSIS — I83813 Varicose veins of bilateral lower extremities with pain: Secondary | ICD-10-CM | POA: Diagnosis not present

## 2018-10-27 NOTE — Progress Notes (Signed)
Patient is a 62 year old female who returns for follow-up today.  She was last seen September 2019.  She has been compliant in her compression stockings.  She states that these do improve her swelling symptoms.  She still does have aching fullness and pain in the legs.  Her family history is significant for varicose veins in her mother.  She has no prior history of DVT bleeding episodes or ulcerations of the leg.  Varicosities began to get worse after prior pregnancy.  Past Medical History:  Diagnosis Date  . GERD (gastroesophageal reflux disease)   . Hypertension   . No pertinent past medical history     Current Outpatient Medications on File Prior to Visit  Medication Sig Dispense Refill  . amoxicillin-clavulanate (AUGMENTIN) 875-125 MG tablet Take 1 tablet by mouth 2 (two) times daily.    Marland Kitchen losartan (COZAAR) 50 MG tablet Take 50 mg by mouth daily.    . pantoprazole (PROTONIX) 40 MG tablet Take 40 mg by mouth daily.    . Wheat Dextrin (BENEFIBER DRINK MIX) PACK Take 4 g by mouth at bedtime.    Marland Kitchen zolpidem (AMBIEN) 10 MG tablet Take 10 mg by mouth at bedtime.      . dicyclomine (BENTYL) 10 MG capsule TAKE 1 CAPSULE THREE TIMES A DAY BEFORE MEALS (Patient not taking: Reported on 10/27/2018) 270 capsule 3   No current facility-administered medications on file prior to visit.    Past Surgical History:  Procedure Laterality Date  . APPENDECTOMY    . BIOPSY  04/10/2016   Procedure: BIOPSY;  Surgeon: Malissa Hippo, MD;  [facility];  Service: Endoscopy;;  Sigmoid colon biopsies  . CHOLECYSTECTOMY  09/05/2011   Procedure: LAPAROSCOPIC CHOLECYSTECTOMY;  Surgeon: Dalia Heading;  Location: [facility];  Service: General;  Laterality: N/A;  . COLONOSCOPY N/A 04/10/2016   Procedure: COLONOSCOPY;  Surgeon: Malissa Hippo, MD;  [facility];  Service: Endoscopy;  Laterality: N/A;  9:30  . ECTOPIC PREGNANCY SURGERY  1982  . TONSILLECTOMY    . WRIST MASS EXCISION  1977   Review  of systems: She has no shortness of breath.  She has no chest pain.  Physical exam:  Vitals:   10/27/18 0825  BP: (!) 179/88  Pulse: 66  Resp: 18  Temp: 98 F (36.7 C)  TempSrc: Oral  SpO2: 98%  Weight: 178 lb 1.6 oz (80.8 kg)  Height: 5' 4.5" (1.638 m)   Skin: No rash large cluster varicosities along the course of the left greater saphenous vein on the medial aspect the left leg beginning in the mid thigh.  There are also large varicosities on the posterior medial aspect of the right popliteal fossa.  These are unchanged from the images that we took on July 29, 2018.  She also has several spider type and reticular type varicosities around the ankle and foot bilaterally  Extremities: 2+ dorsalis pedis pulses bilaterally  Data: I reviewed the patient's previous duplex exam dated July 29, 2018.  This showed reflux in the right greater saphenous vein at saphenofemoral junction proximal thigh mid thigh distal thigh and knee as well as proximal calf.  Vein diameter was 5 to 8 mm.  On the left side there were similar findings.  However there is significant tortuosity in the distal thigh and from the mid thigh up it is 5 mm in diameter.  Of note the lesser saphenous on the right side was 5 mm in diameter.  There  was no evidence of DVT bilaterally.  Assessment: Symptomatic varicose veins no failure of conservative management.  The patient wishes to consider laser ablation.  I believe she would benefit from laser ablation of the left mid saphenous vein with combined stab avulsions of the mid thigh left side and calf greater than 30 stabs.  She would then need subsequent similar procedure on the right leg.  Plan: We will schedule patient for left leg treatment followed by right leg treatment pending insurance approval.  We will call the patient regarding procedure scheduling after we discussed this with her insurance company.  Fabienne Brunsharles Claudius Mich, MD [facility]  [place] Office: [phone] Pager: [phone]

## 2018-11-19 ENCOUNTER — Other Ambulatory Visit: Payer: Self-pay | Admitting: *Deleted

## 2018-11-19 DIAGNOSIS — I83813 Varicose veins of bilateral lower extremities with pain: Secondary | ICD-10-CM

## 2018-12-29 ENCOUNTER — Encounter: Payer: Self-pay | Admitting: Vascular Surgery

## 2018-12-29 ENCOUNTER — Other Ambulatory Visit: Payer: Self-pay

## 2018-12-29 ENCOUNTER — Ambulatory Visit (INDEPENDENT_AMBULATORY_CARE_PROVIDER_SITE_OTHER): Payer: Managed Care, Other (non HMO) | Admitting: Vascular Surgery

## 2018-12-29 VITALS — BP 122/78 | HR 72 | Temp 97.5°F | Resp 16 | Ht 64.5 in | Wt 170.0 lb

## 2018-12-29 DIAGNOSIS — I83812 Varicose veins of left lower extremities with pain: Secondary | ICD-10-CM | POA: Diagnosis not present

## 2018-12-29 HISTORY — PX: ENDOVENOUS ABLATION SAPHENOUS VEIN W/ LASER: SUR449

## 2018-12-29 NOTE — Progress Notes (Signed)
     Laser Ablation Procedure    Date: 12/29/2018   Janet Fields DOB:04/14/1956  Consent signed: Yes    Surgeon:  Dr. Fabienne Bruns  Procedure: Laser Ablation: left Greater Saphenous Vein  BP 122/78 (BP Location: Left Arm, Patient Position: Sitting, Cuff Size: Normal)   Pulse 72   Temp (!) 97.5 F (36.4 C) (Oral)   Resp 16   Ht 5' 4.5" (1.638 m)   Wt 170 lb (77.1 kg)   SpO2 100%   BMI 28.73 kg/m   Tumescent Anesthesia: 500 cc 0.9% NaCl with 50 cc Lidocaine HCL 1% and 15 cc 8.4% NaHCO3  Local Anesthesia: 7 cc Lidocaine HCL and NaHCO3 (ratio 2:1)  15 watts continuous mode        Total energy: 1268 Joules   Total time: 1:24   Stab Phlebectomy: >20 Sites: Thigh and Calf  Patient tolerated procedure well    Description of Procedure:  After marking the course of the secondary varicosities, the patient was placed on the operating table in the supine position, and the left leg was prepped and draped in sterile fashion.   Local anesthetic was administered and under ultrasound guidance the saphenous vein was accessed with a micro needle and guide wire; then the mirco puncture sheath was placed.  A guide wire was inserted saphenofemoral junction , followed by a 5 french sheath.  The position of the sheath and then the laser fiber below the junction was confirmed using the ultrasound.  Tumescent anesthesia was administered along the course of the saphenous vein using ultrasound guidance. The patient was placed in Trendelenburg position and protective laser glasses were placed on patient and staff, and the laser was fired at 15 watts continuous mode advancing 1-65mm/second for a total of 1268 joules.   For stab phlebectomies, local anesthetic was administered at the previously marked varicosities, and tumescent anesthesia was administered around the vessels.  Greater than 20 stab wounds were made using the tip of an 11 blade. And using the vein hook, the phlebectomies were performed  using a hemostat to avulse the varicosities.  Adequate hemostasis was achieved.     Steri strips were applied to the stab wounds and ABD pads and thigh high compression stockings were applied.  Ace wrap bandages were applied over the phlebectomy sites and at the top of the saphenofemoral junction. Blood loss was less than 15 cc.  The patient ambulated out of the operating room having tolerated the procedure well.  Fabienne Bruns, MD [facility] Office: [phone] Pager: [phone]

## 2019-01-04 ENCOUNTER — Encounter: Payer: Self-pay | Admitting: Vascular Surgery

## 2019-01-05 ENCOUNTER — Encounter: Payer: Self-pay | Admitting: Vascular Surgery

## 2019-01-05 ENCOUNTER — Ambulatory Visit: Payer: Managed Care, Other (non HMO) | Admitting: Vascular Surgery

## 2019-01-05 ENCOUNTER — Other Ambulatory Visit: Payer: Self-pay

## 2019-01-05 ENCOUNTER — Encounter (HOSPITAL_COMMUNITY): Payer: Managed Care, Other (non HMO)

## 2019-01-05 ENCOUNTER — Ambulatory Visit (INDEPENDENT_AMBULATORY_CARE_PROVIDER_SITE_OTHER): Payer: Self-pay | Admitting: Vascular Surgery

## 2019-01-05 ENCOUNTER — Ambulatory Visit (HOSPITAL_COMMUNITY)
Admission: RE | Admit: 2019-01-05 | Discharge: 2019-01-05 | Disposition: A | Discharge status: Home / Self Care | Payer: Managed Care, Other (non HMO) | Source: Ambulatory Visit | Attending: Vascular Surgery | Admitting: Vascular Surgery

## 2019-01-05 VITALS — BP 135/84 | HR 84 | Temp 97.4°F | Resp 18 | Ht 64.5 in | Wt 170.0 lb

## 2019-01-05 DIAGNOSIS — I83813 Varicose veins of bilateral lower extremities with pain: Secondary | ICD-10-CM

## 2019-01-05 NOTE — Progress Notes (Signed)
Patient is a 64 year old female who returns for follow-up today.  She recently underwent laser ablation of the left greater saphenous vein.  She also had about 40 stab avulsions.  She reports minimal pain around the stab sites in the thigh.  She also has minimal pain in the upper high thigh from her laser ablation.  She reports no shortness of breath.  She is scheduled to have her right leg done March 11.  Physical exam:  Vitals:   01/05/19 1422  BP: 135/84  Pulse: 84  Resp: 18  Temp: (!) 97.4 F (36.3 C)  TempSrc: Oral  SpO2: 99%  Weight: 170 lb (77.1 kg)  Height: 5' 4.5" (1.638 m)    Left lower extremity: No significant edema mild tenderness left inner upper thigh and left anterior thigh healing stab incisions diffusely left leg  Data: Patient has successful closure of the left greater saphenous vein by duplex ultrasound no evidence of DVT  Assessment: Successful left greater saphenous vein ablation and multiple stab avulsions  Plan: Patient will follow-up with [place] March 11 for laser ablation of the right greater saphenous and stab avulsions.  Fabienne Bruns, MD [facility] Office: [phone] Pager: [phone]

## 2019-01-26 ENCOUNTER — Other Ambulatory Visit: Payer: Self-pay

## 2019-01-26 ENCOUNTER — Ambulatory Visit (INDEPENDENT_AMBULATORY_CARE_PROVIDER_SITE_OTHER): Payer: Managed Care, Other (non HMO) | Admitting: Vascular Surgery

## 2019-01-26 ENCOUNTER — Encounter: Payer: Self-pay | Admitting: Vascular Surgery

## 2019-01-26 VITALS — BP 154/79 | HR 61 | Temp 97.4°F | Resp 16 | Ht 64.5 in | Wt 170.0 lb

## 2019-01-26 DIAGNOSIS — I83811 Varicose veins of right lower extremities with pain: Secondary | ICD-10-CM | POA: Diagnosis not present

## 2019-01-26 HISTORY — PX: ENDOVENOUS ABLATION SAPHENOUS VEIN W/ LASER: SUR449

## 2019-01-26 NOTE — Progress Notes (Signed)
     Laser Ablation Procedure    Date: 01/26/2019   RITU PADBERG DOB:Feb 27, 1956  Consent signed: Yes    Surgeon:  Dr. Fabienne Bruns   Procedure: Laser Ablation: right Greater Saphenous Vein  BP (!) 154/79 (BP Location: Left Arm, Patient Position: Sitting, Cuff Size: Normal)   Pulse 61   Temp (!) 97.4 F (36.3 C) (Oral)   Resp 16   Ht 5' 4.5" (1.638 m)   Wt 170 lb (77.1 kg)   SpO2 100%   BMI 28.73 kg/m   Tumescent Anesthesia: 450 cc 0.9% NaCl with 50 cc Lidocaine HCL 1% and 15 cc 8.4% NaHCO3  Local Anesthesia: 10 cc Lidocaine HCL and NaHCO3 (ratio 2:1)  15 watts continuous mode        Total energy: 2212 Joules    Total time: 2:26    Stab Phlebectomy: 10-20 Sites: Calf  Patient tolerated procedure well    Description of Procedure:  After marking the course of the secondary varicosities, the patient was placed on the operating table in the supine position, and the right leg was prepped and draped in sterile fashion.   Local anesthetic was administered and under ultrasound guidance the saphenous vein was accessed with a micro needle and guide wire; then the mirco puncture sheath was placed.  A guide wire was inserted saphenofemoral junction , followed by a 5 french sheath.  The position of the sheath and then the laser fiber below the junction was confirmed using the ultrasound.  Tumescent anesthesia was administered along the course of the saphenous vein using ultrasound guidance. The patient was placed in Trendelenburg position and protective laser glasses were placed on patient and staff, and the laser was fired at 15 watts continuous mode advancing 1-23mm/second for a total of 2212 joules.   For stab phlebectomies, local anesthetic was administered at the previously marked varicosities, and tumescent anesthesia was administered around the vessels.  Ten to 20 stab wounds were made using the tip of an 11 blade. And using the vein hook, the phlebectomies were performed using a  hemostat to avulse the varicosities.  Adequate hemostasis was achieved.     Steri strips were applied to the stab wounds and ABD pads and thigh high compression stockings were applied.  Ace wrap bandages were applied over the phlebectomy sites and at the top of the saphenofemoral junction. Blood loss was less than 15 cc.  The patient ambulated out of the operating room having tolerated the procedure well.  Fabienne Bruns, MD [facility] Office: [phone] Pager: [phone]

## 2019-02-09 ENCOUNTER — Telehealth (HOSPITAL_COMMUNITY): Payer: Self-pay | Admitting: Rehabilitation

## 2019-02-09 ENCOUNTER — Encounter: Payer: Self-pay | Admitting: Family

## 2019-02-09 ENCOUNTER — Telehealth: Payer: Self-pay | Admitting: *Deleted

## 2019-02-09 ENCOUNTER — Ambulatory Visit: Payer: Managed Care, Other (non HMO) | Admitting: Vascular Surgery

## 2019-02-09 ENCOUNTER — Inpatient Hospital Stay (HOSPITAL_COMMUNITY): Admission: RE | Admit: 2019-02-09 | Payer: Managed Care, Other (non HMO) | Source: Ambulatory Visit

## 2019-02-09 NOTE — Telephone Encounter (Signed)
Spoke with Janet Fields and notified her that her her post laser ablation duplex and follow up appointments with Dr. Darrick Penna have been rescheduled to tomorrow. Her post LA duplex is scheduled for tomorrow 02-10-2019 at 12:00PM and her VV FU with Dr. Darrick Penna at 12:45 PM.  Mrs. Janet Fields verbalized understanding of appointment dates and times.

## 2019-02-10 ENCOUNTER — Ambulatory Visit (INDEPENDENT_AMBULATORY_CARE_PROVIDER_SITE_OTHER): Payer: Self-pay | Admitting: Vascular Surgery

## 2019-02-10 ENCOUNTER — Encounter: Payer: Self-pay | Admitting: Vascular Surgery

## 2019-02-10 ENCOUNTER — Ambulatory Visit (HOSPITAL_COMMUNITY)
Admission: RE | Admit: 2019-02-10 | Discharge: 2019-02-10 | Disposition: A | Discharge status: Home / Self Care | Payer: Managed Care, Other (non HMO) | Source: Ambulatory Visit | Attending: Vascular Surgery | Admitting: Vascular Surgery

## 2019-02-10 ENCOUNTER — Other Ambulatory Visit: Payer: Self-pay

## 2019-02-10 VITALS — BP 123/73 | HR 73 | Temp 98.5°F | Resp 18 | Ht 64.5 in | Wt 170.0 lb

## 2019-02-10 DIAGNOSIS — I83813 Varicose veins of bilateral lower extremities with pain: Secondary | ICD-10-CM

## 2019-02-10 NOTE — Progress Notes (Signed)
Patient is a 63 year old female who returns for follow-up today.  She recently underwent stab avulsions and laser ablation of the right greater saphenous vein.  She reports still some soreness over the right medial thigh but overall is doing well.  She has no shortness of breath or chest pain.  She previously had laser ablation in the left leg a few months ago.  Physical exam:  Vitals:   02/10/19 1222  BP: 123/73  Pulse: 73  Resp: 18  Temp: 98.5 F (36.9 C)  TempSrc: Oral  SpO2: 96%  Weight: 170 lb (77.1 kg)  Height: 5' 4.5" (1.638 m)    Extremities: Right and left legs fairly symmetric in size, mild tenderness right medial thigh well-healed incisions right and left leg  Data: Patient had a duplex ultrasound today which shows successful closure of her right greater saphenous vein within 1 cm of the saphenofemoral junction and no evidence of DVT.  Assessment: Doing well status post bilateral laser ablation bilateral stab phlebectomies  Plan: The patient will continue to wear her compression garments as much as possible.  She will follow-up with [place] on an as-needed basis.  Fabienne Bruns, MD [facility] [facility] Office: [phone] Pager: [phone] \

## 2019-08-05 ENCOUNTER — Other Ambulatory Visit (HOSPITAL_COMMUNITY): Payer: Self-pay | Admitting: Family Medicine

## 2019-08-05 DIAGNOSIS — Z1231 Encounter for screening mammogram for malignant neoplasm of breast: Secondary | ICD-10-CM

## 2019-09-09 ENCOUNTER — Ambulatory Visit (HOSPITAL_COMMUNITY)
Admission: RE | Admit: 2019-09-09 | Discharge: 2019-09-09 | Disposition: A | Discharge status: Home / Self Care | Payer: Managed Care, Other (non HMO) | Source: Ambulatory Visit | Attending: Family Medicine | Admitting: Family Medicine

## 2019-09-09 ENCOUNTER — Ambulatory Visit (HOSPITAL_COMMUNITY): Payer: Managed Care, Other (non HMO)

## 2019-09-09 ENCOUNTER — Other Ambulatory Visit: Payer: Self-pay

## 2019-09-09 DIAGNOSIS — Z1231 Encounter for screening mammogram for malignant neoplasm of breast: Secondary | ICD-10-CM

## 2020-08-22 ENCOUNTER — Other Ambulatory Visit (HOSPITAL_COMMUNITY): Payer: Self-pay | Admitting: Family Medicine

## 2020-08-22 DIAGNOSIS — Z1231 Encounter for screening mammogram for malignant neoplasm of breast: Secondary | ICD-10-CM

## 2020-09-13 ENCOUNTER — Ambulatory Visit (HOSPITAL_COMMUNITY)
Admission: RE | Admit: 2020-09-13 | Discharge: 2020-09-13 | Disposition: A | Discharge status: Home / Self Care | Payer: Medicare Other | Source: Ambulatory Visit | Attending: Family Medicine | Admitting: Family Medicine

## 2020-09-13 ENCOUNTER — Other Ambulatory Visit: Payer: Self-pay

## 2020-09-13 DIAGNOSIS — Z1231 Encounter for screening mammogram for malignant neoplasm of breast: Secondary | ICD-10-CM | POA: Diagnosis not present

## 2020-09-18 DIAGNOSIS — I10 Essential (primary) hypertension: Secondary | ICD-10-CM | POA: Diagnosis not present

## 2020-09-18 DIAGNOSIS — Z1329 Encounter for screening for other suspected endocrine disorder: Secondary | ICD-10-CM | POA: Diagnosis not present

## 2020-09-18 DIAGNOSIS — E7800 Pure hypercholesterolemia, unspecified: Secondary | ICD-10-CM | POA: Diagnosis not present

## 2020-09-25 DIAGNOSIS — M7752 Other enthesopathy of left foot: Secondary | ICD-10-CM | POA: Diagnosis not present

## 2020-09-25 DIAGNOSIS — M2141 Flat foot [pes planus] (acquired), right foot: Secondary | ICD-10-CM | POA: Diagnosis not present

## 2020-09-26 DIAGNOSIS — Z0000 Encounter for general adult medical examination without abnormal findings: Secondary | ICD-10-CM | POA: Diagnosis not present

## 2020-09-26 DIAGNOSIS — Z23 Encounter for immunization: Secondary | ICD-10-CM | POA: Diagnosis not present

## 2020-11-30 DIAGNOSIS — B349 Viral infection, unspecified: Secondary | ICD-10-CM | POA: Diagnosis not present

## 2020-12-15 DIAGNOSIS — I10 Essential (primary) hypertension: Secondary | ICD-10-CM | POA: Diagnosis not present

## 2020-12-15 DIAGNOSIS — E7800 Pure hypercholesterolemia, unspecified: Secondary | ICD-10-CM | POA: Diagnosis not present

## 2021-09-02 ENCOUNTER — Other Ambulatory Visit (HOSPITAL_COMMUNITY): Payer: Self-pay | Admitting: Family Medicine

## 2021-09-02 DIAGNOSIS — Z1231 Encounter for screening mammogram for malignant neoplasm of breast: Secondary | ICD-10-CM

## 2021-09-18 ENCOUNTER — Other Ambulatory Visit: Payer: Self-pay

## 2021-09-18 ENCOUNTER — Ambulatory Visit (HOSPITAL_COMMUNITY)
Admission: RE | Admit: 2021-09-18 | Discharge: 2021-09-18 | Disposition: A | Discharge status: Home / Self Care | Payer: Medicare Other | Source: Ambulatory Visit | Attending: Family Medicine | Admitting: Family Medicine

## 2021-09-18 DIAGNOSIS — Z1231 Encounter for screening mammogram for malignant neoplasm of breast: Secondary | ICD-10-CM | POA: Diagnosis not present

## 2021-12-03 IMAGING — MG DIGITAL SCREENING BILAT W/ TOMO W/ CAD
8 series · 8 of 24 positions shown · non-contrast
Comparison: Previous exam(s).

CLINICAL DATA: Screening.

EXAM:
DIGITAL SCREENING BILATERAL MAMMOGRAM WITH TOMO AND CAD

[R CC synth-2D]
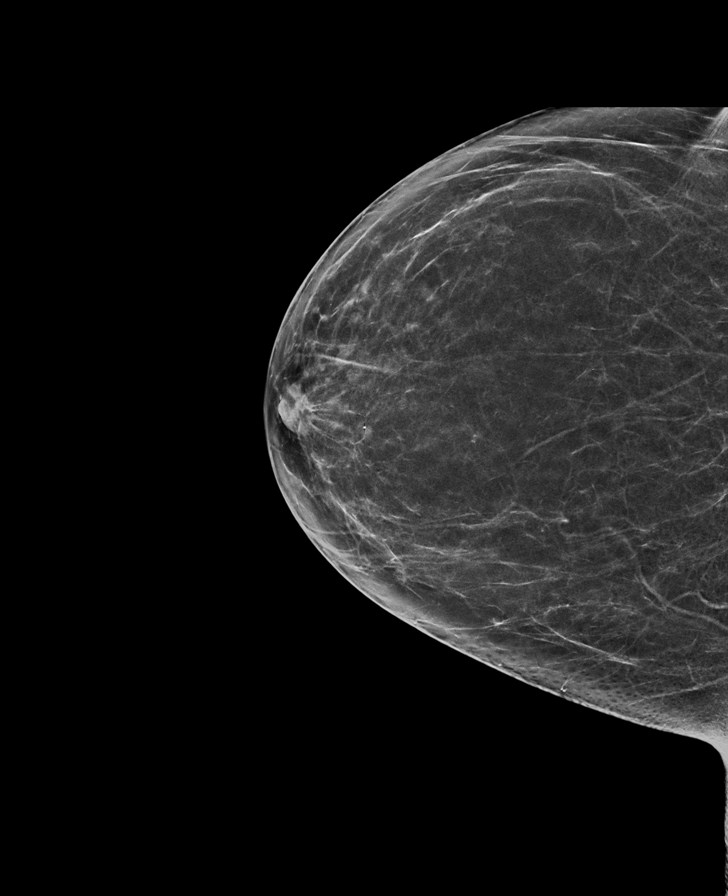

[L CC synth-2D]
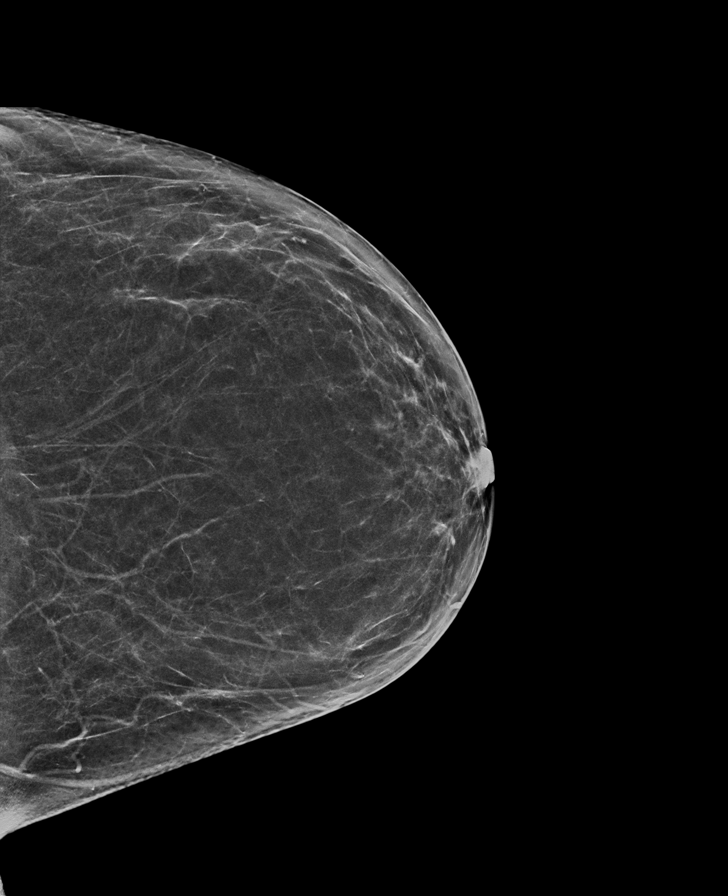

[R MLO synth-2D]
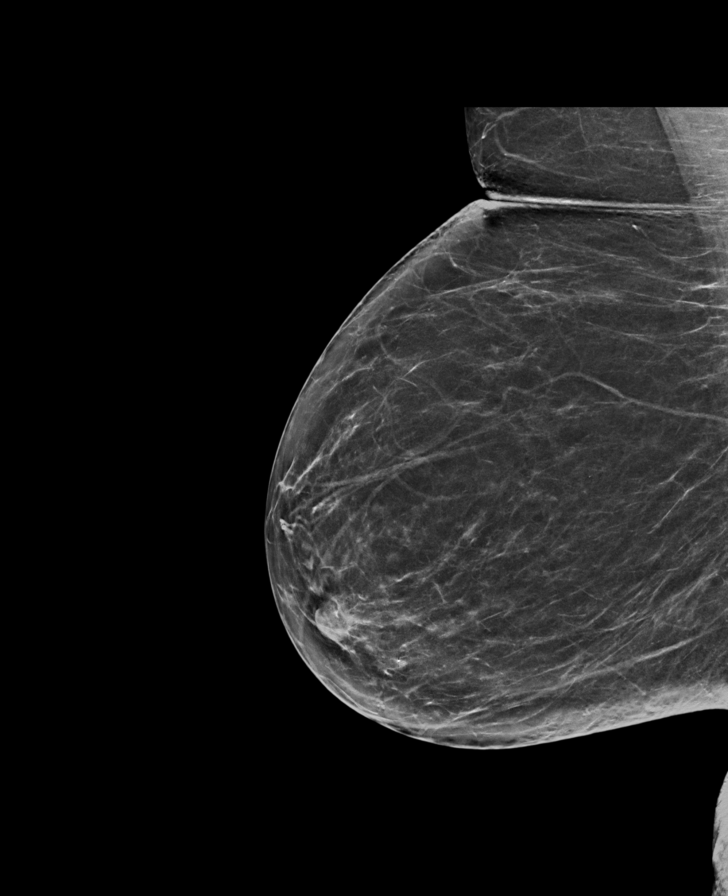

[L MLO synth-2D]
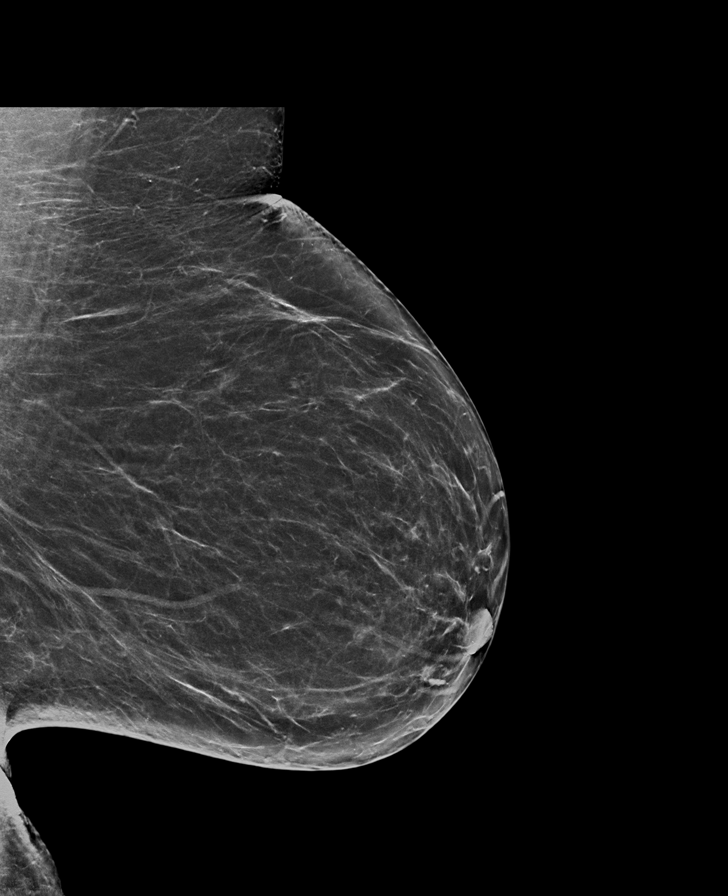

[R MLO tomo · tomo slice 36/71.0]
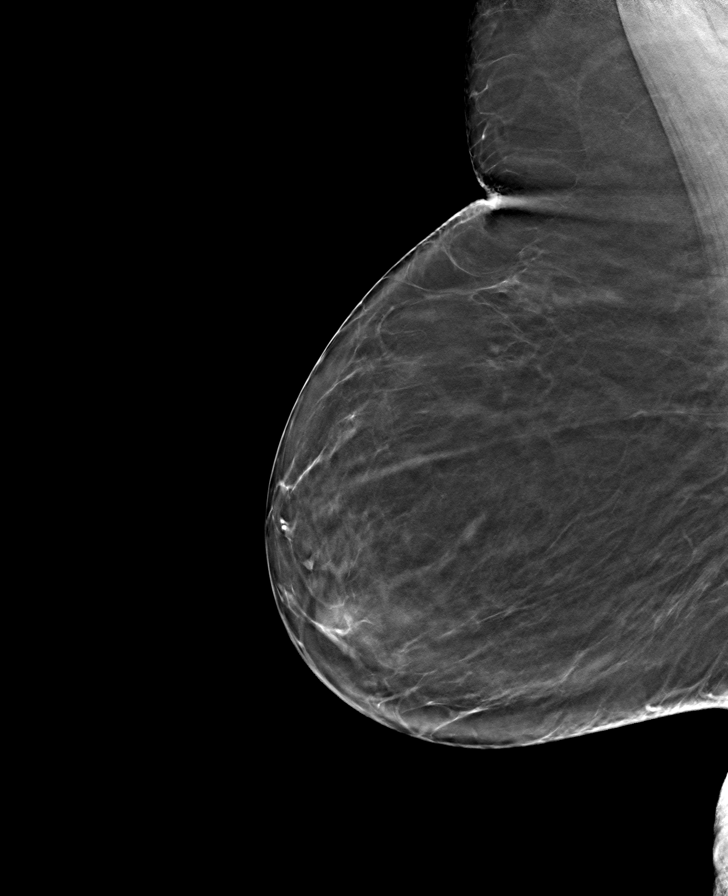

[L MLO tomo · tomo slice 36/71.0]
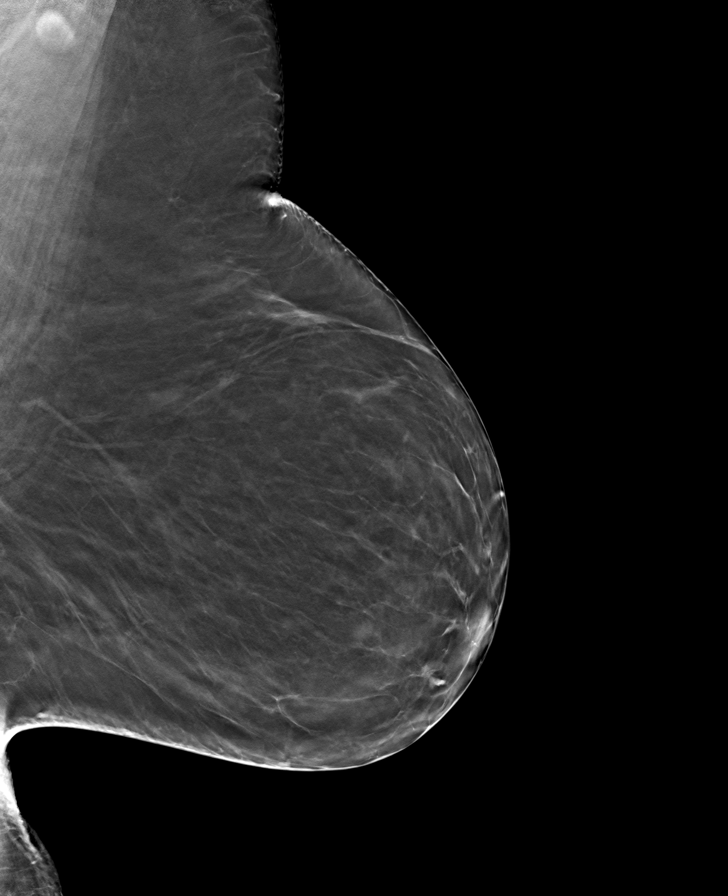

[R CC tomo · tomo slice 34/67.0]
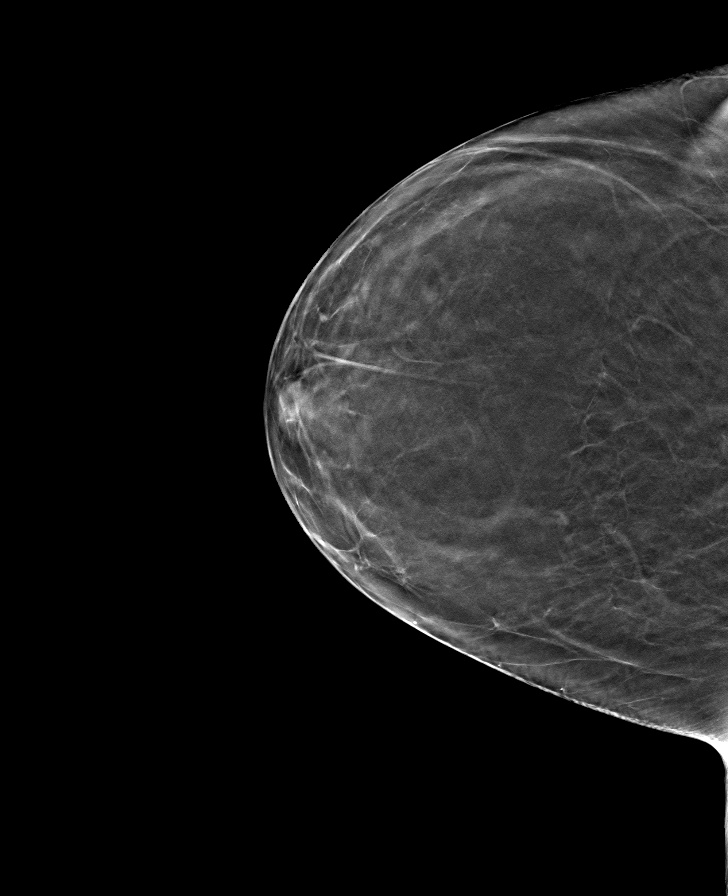

[L CC tomo · tomo slice 35/68.0]
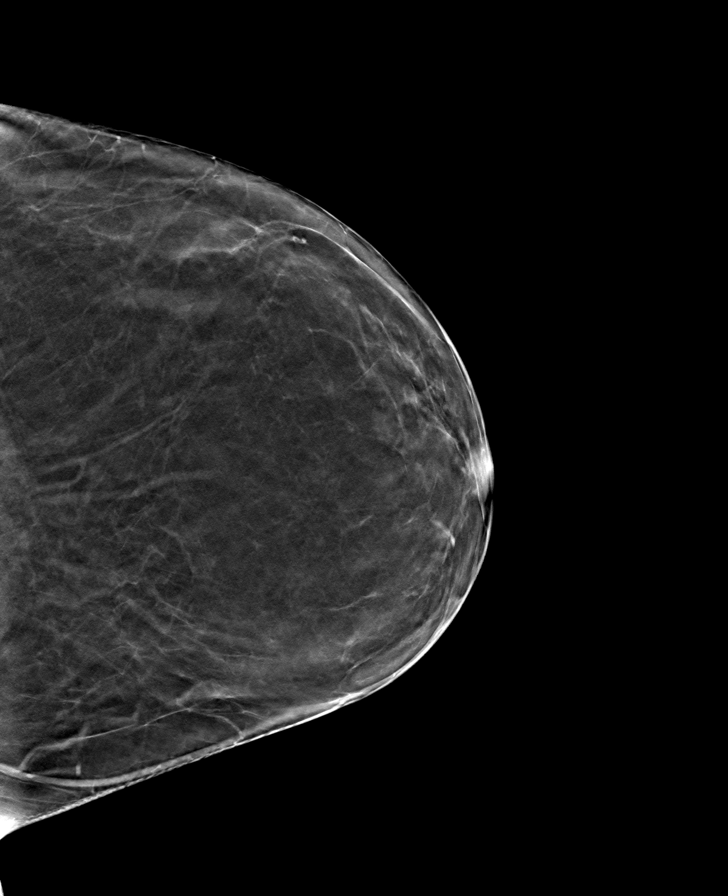

[8 of 24 positions shown; findings below may reference images not displayed]

ACR Breast Density Category b: There are scattered areas of
fibroglandular density.
FINDINGS: There are no findings suspicious for malignancy. Images were
processed with CAD.
IMPRESSION: No mammographic evidence of malignancy. A result letter of this
screening mammogram will be mailed directly to the patient.

RECOMMENDATION:
Screening mammogram in one year. (Code:[id number])

BI-RADS CATEGORY  1: Negative.

## 2022-05-21 DIAGNOSIS — K219 Gastro-esophageal reflux disease without esophagitis: Secondary | ICD-10-CM | POA: Diagnosis not present

## 2022-05-21 DIAGNOSIS — R079 Chest pain, unspecified: Secondary | ICD-10-CM | POA: Diagnosis not present

## 2022-05-21 DIAGNOSIS — M79606 Pain in leg, unspecified: Secondary | ICD-10-CM | POA: Diagnosis not present

## 2022-05-21 DIAGNOSIS — G4700 Insomnia, unspecified: Secondary | ICD-10-CM | POA: Diagnosis not present

## 2022-05-21 DIAGNOSIS — I10 Essential (primary) hypertension: Secondary | ICD-10-CM | POA: Diagnosis not present

## 2022-05-22 DIAGNOSIS — I10 Essential (primary) hypertension: Secondary | ICD-10-CM | POA: Diagnosis not present

## 2022-05-22 DIAGNOSIS — I25118 Atherosclerotic heart disease of native coronary artery with other forms of angina pectoris: Secondary | ICD-10-CM | POA: Diagnosis not present

## 2022-05-22 DIAGNOSIS — R0989 Other specified symptoms and signs involving the circulatory and respiratory systems: Secondary | ICD-10-CM | POA: Diagnosis not present

## 2022-06-10 DIAGNOSIS — I6521 Occlusion and stenosis of right carotid artery: Secondary | ICD-10-CM | POA: Diagnosis not present

## 2022-06-10 DIAGNOSIS — R0989 Other specified symptoms and signs involving the circulatory and respiratory systems: Secondary | ICD-10-CM | POA: Diagnosis not present

## 2022-06-10 DIAGNOSIS — I6523 Occlusion and stenosis of bilateral carotid arteries: Secondary | ICD-10-CM | POA: Diagnosis not present

## 2022-06-10 DIAGNOSIS — I771 Stricture of artery: Secondary | ICD-10-CM | POA: Diagnosis not present

## 2022-06-14 DIAGNOSIS — M79606 Pain in leg, unspecified: Secondary | ICD-10-CM | POA: Diagnosis not present

## 2022-06-14 DIAGNOSIS — R030 Elevated blood-pressure reading, without diagnosis of hypertension: Secondary | ICD-10-CM | POA: Diagnosis not present

## 2022-06-14 DIAGNOSIS — L03221 Cellulitis of neck: Secondary | ICD-10-CM | POA: Diagnosis not present

## 2022-06-16 DIAGNOSIS — M79662 Pain in left lower leg: Secondary | ICD-10-CM | POA: Diagnosis not present

## 2022-06-16 DIAGNOSIS — M79605 Pain in left leg: Secondary | ICD-10-CM | POA: Diagnosis not present

## 2022-06-26 DIAGNOSIS — R079 Chest pain, unspecified: Secondary | ICD-10-CM | POA: Diagnosis not present

## 2022-06-26 DIAGNOSIS — I10 Essential (primary) hypertension: Secondary | ICD-10-CM | POA: Diagnosis not present

## 2022-06-26 DIAGNOSIS — K219 Gastro-esophageal reflux disease without esophagitis: Secondary | ICD-10-CM | POA: Diagnosis not present

## 2022-06-26 DIAGNOSIS — G4700 Insomnia, unspecified: Secondary | ICD-10-CM | POA: Diagnosis not present

## 2022-06-26 DIAGNOSIS — M79606 Pain in leg, unspecified: Secondary | ICD-10-CM | POA: Diagnosis not present

## 2022-06-26 DIAGNOSIS — L03221 Cellulitis of neck: Secondary | ICD-10-CM | POA: Diagnosis not present

## 2022-06-30 DIAGNOSIS — L03221 Cellulitis of neck: Secondary | ICD-10-CM | POA: Diagnosis not present

## 2022-06-30 DIAGNOSIS — R079 Chest pain, unspecified: Secondary | ICD-10-CM | POA: Diagnosis not present

## 2022-06-30 DIAGNOSIS — M79606 Pain in leg, unspecified: Secondary | ICD-10-CM | POA: Diagnosis not present

## 2022-06-30 DIAGNOSIS — K219 Gastro-esophageal reflux disease without esophagitis: Secondary | ICD-10-CM | POA: Diagnosis not present

## 2022-06-30 DIAGNOSIS — I10 Essential (primary) hypertension: Secondary | ICD-10-CM | POA: Diagnosis not present

## 2022-06-30 DIAGNOSIS — G4700 Insomnia, unspecified: Secondary | ICD-10-CM | POA: Diagnosis not present

## 2022-09-22 DIAGNOSIS — Z1231 Encounter for screening mammogram for malignant neoplasm of breast: Secondary | ICD-10-CM | POA: Diagnosis not present

## 2022-10-07 DIAGNOSIS — M810 Age-related osteoporosis without current pathological fracture: Secondary | ICD-10-CM | POA: Diagnosis not present

## 2022-10-07 DIAGNOSIS — M8589 Other specified disorders of bone density and structure, multiple sites: Secondary | ICD-10-CM | POA: Diagnosis not present

## 2022-10-13 DIAGNOSIS — Z131 Encounter for screening for diabetes mellitus: Secondary | ICD-10-CM | POA: Diagnosis not present

## 2022-10-13 DIAGNOSIS — E559 Vitamin D deficiency, unspecified: Secondary | ICD-10-CM | POA: Diagnosis not present

## 2022-10-13 DIAGNOSIS — Z1329 Encounter for screening for other suspected endocrine disorder: Secondary | ICD-10-CM | POA: Diagnosis not present

## 2022-10-13 DIAGNOSIS — K219 Gastro-esophageal reflux disease without esophagitis: Secondary | ICD-10-CM | POA: Diagnosis not present

## 2022-10-13 DIAGNOSIS — I10 Essential (primary) hypertension: Secondary | ICD-10-CM | POA: Diagnosis not present

## 2022-10-20 DIAGNOSIS — G4700 Insomnia, unspecified: Secondary | ICD-10-CM | POA: Diagnosis not present

## 2022-10-20 DIAGNOSIS — Z0001 Encounter for general adult medical examination with abnormal findings: Secondary | ICD-10-CM | POA: Diagnosis not present

## 2022-10-20 DIAGNOSIS — K219 Gastro-esophageal reflux disease without esophagitis: Secondary | ICD-10-CM | POA: Diagnosis not present

## 2022-10-20 DIAGNOSIS — I10 Essential (primary) hypertension: Secondary | ICD-10-CM | POA: Diagnosis not present

## 2022-10-20 DIAGNOSIS — Z23 Encounter for immunization: Secondary | ICD-10-CM | POA: Diagnosis not present

## 2022-10-20 DIAGNOSIS — M79606 Pain in leg, unspecified: Secondary | ICD-10-CM | POA: Diagnosis not present

## 2022-12-03 ENCOUNTER — Encounter: Payer: Self-pay | Admitting: Diagnostic Neuroimaging

## 2022-12-03 ENCOUNTER — Ambulatory Visit: Payer: Medicare Other | Admitting: Diagnostic Neuroimaging

## 2022-12-03 VITALS — BP 163/82 | HR 69 | Ht 65.0 in | Wt 208.0 lb

## 2022-12-03 DIAGNOSIS — R200 Anesthesia of skin: Secondary | ICD-10-CM

## 2022-12-03 DIAGNOSIS — R202 Paresthesia of skin: Secondary | ICD-10-CM | POA: Diagnosis not present

## 2022-12-03 DIAGNOSIS — R799 Abnormal finding of blood chemistry, unspecified: Secondary | ICD-10-CM | POA: Diagnosis not present

## 2022-12-03 DIAGNOSIS — Z79899 Other long term (current) drug therapy: Secondary | ICD-10-CM | POA: Diagnosis not present

## 2022-12-03 DIAGNOSIS — Z5181 Encounter for therapeutic drug level monitoring: Secondary | ICD-10-CM | POA: Diagnosis not present

## 2022-12-03 MED ORDER — DULOXETINE HCL 30 MG PO CPEP: 30.0000 mg | ORAL_CAPSULE | Freq: Every day | ORAL | 6 refills | Status: AC

## 2022-12-03 NOTE — Patient Instructions (Signed)
-  check EMG/NCS, neuropathy labs - trial of duloxetine 30mg  daily

## 2022-12-03 NOTE — Progress Notes (Signed)
[facility]  PATIENT: Janet Fields DOB: 06/24/1956  REFERRING CLINICIAN: [place], MD HISTORY FROM: patient  REASON FOR VISIT: new consult    HISTORICAL  CHIEF COMPLAINT:  Chief Complaint  Patient presents with   New Patient (Initial Visit)    Patient in room #7 and alone. Patient here today discuss peripheral neuropathy in feet.    HISTORY OF PRESENT ILLNESS:   67 year old female here for evaluation of numbness and tingling in the feet.  Past 1 year has had stinging and burning sensation in the toes and feet.  In the past 6 months this is spread to her hands.  She tried gabapentin 100 mg twice a day for 1 month without relief.  No specific triggering or aggravating factors.   REVIEW OF SYSTEMS: Full 14 system review of systems performed and negative with exception of: as per HPI.  ALLERGIES: Allergies  Allergen Reactions   Estrogens Hives    HOME MEDICATIONS: Outpatient Medications Prior to Visit  Medication Sig Dispense Refill   furosemide (LASIX) 20 MG tablet Take 20 mg by mouth.     hydrochlorothiazide (HYDRODIURIL) 25 MG tablet      losartan (COZAAR) 50 MG tablet Take 100 mg by mouth daily.     metoprolol succinate (TOPROL-XL) 50 MG 24 hr tablet Take 1 tablet by mouth daily.     pantoprazole (PROTONIX) 40 MG tablet Take 40 mg by mouth daily.     rosuvastatin (CRESTOR) 5 MG tablet Take 5 mg by mouth daily.     rosuvastatin (CRESTOR) 5 MG tablet Take 1 tablet by mouth every evening.     Wheat Dextrin (BENEFIBER DRINK MIX) PACK Take 4 g by mouth at bedtime.     zolpidem (AMBIEN) 10 MG tablet Take 10 mg by mouth at bedtime.       amoxicillin-clavulanate (AUGMENTIN) 875-125 MG tablet Take 1 tablet by mouth 2 (two) times daily. (Patient not taking: Reported on 12/03/2022)     dicyclomine (BENTYL) 10 MG capsule TAKE 1 CAPSULE THREE TIMES A DAY BEFORE MEALS (Patient not taking: Reported on 10/27/2018) 270 capsule 3   No  facility-administered medications prior to visit.    PAST MEDICAL HISTORY: Past Medical History:  Diagnosis Date   GERD (gastroesophageal reflux disease)    Hypertension    No pertinent past medical history     PAST SURGICAL HISTORY: Past Surgical History:  Procedure Laterality Date   APPENDECTOMY     BIOPSY  04/10/2016   Procedure: BIOPSY;  Surgeon: Rogene Houston, MD;  Location: [facility];  Service: Endoscopy;;  Sigmoid colon biopsies   CHOLECYSTECTOMY  09/05/2011   Procedure: LAPAROSCOPIC CHOLECYSTECTOMY;  Surgeon: Jamesetta So;  Location: [facility];  Service: General;  Laterality: N/A;   COLONOSCOPY N/A 04/10/2016   Procedure: COLONOSCOPY;  Surgeon: Rogene Houston, MD;  Location: [facility];  Service: Endoscopy;  Laterality: N/A;  9:30   ECTOPIC PREGNANCY SURGERY  1982   ENDOVENOUS ABLATION SAPHENOUS VEIN W/ LASER Left 12/29/2018   endovenous laser ablation left greater saphenous vein and stab phlebectomy > 20 incisions by Ruta Hinds MD    ENDOVENOUS ABLATION SAPHENOUS VEIN W/ LASER Right 01/26/2019   endovenous laser ablation right greater saphenous vein and stab phlebectomy 10-20 incisions right leg by Ruta Hinds MD    TONSILLECTOMY     WRIST MASS EXCISION  1977    FAMILY HISTORY: Family History  Problem Relation Age of Onset   Varicose Veins  Mother     SOCIAL HISTORY: Social History   Socioeconomic History   Marital status: Married    Spouse name: Not on file   Number of children: Not on file   Years of education: Not on file   Highest education level: Not on file  Occupational History   Not on file  Tobacco Use   Smoking status: Never   Smokeless tobacco: Never  Substance and Sexual Activity   Alcohol use: Yes    Comment: occassionally   Drug use: No   Sexual activity: Not on file  Other Topics Concern   Not on file  Social History Narrative   Not on file   Social Determinants of Health   Financial Resource Strain: Not on file  Food  Insecurity: Not on file  Transportation Needs: Not on file  Physical Activity: Not on file  Stress: Not on file  Social Connections: Not on file  Intimate Partner Violence: Not on file     PHYSICAL EXAM  GENERAL EXAM/CONSTITUTIONAL: Vitals:  Vitals:   12/03/22 1113  BP: (!) 163/82  Pulse: 69  Weight: 208 lb (94.3 kg)  Height: 5\' 5"  (1.651 m)   Body mass index is 34.61 kg/m. Wt Readings from Last 3 Encounters:  12/03/22 208 lb (94.3 kg)  02/10/19 170 lb (77.1 kg)  01/26/19 170 lb (77.1 kg)   Patient is in no distress; well developed, nourished and groomed; neck is supple  CARDIOVASCULAR: Examination of carotid arteries is normal; no carotid bruits Regular rate and rhythm, no murmurs Examination of peripheral vascular system by observation and palpation is normal  EYES: Ophthalmoscopic exam of optic discs and posterior segments is normal; no papilledema or hemorrhages No results found.  MUSCULOSKELETAL: Gait, strength, tone, movements noted in Neurologic exam below  NEUROLOGIC: MENTAL STATUS:      No data to display         awake, alert, oriented to person, place and time recent and remote memory intact normal attention and concentration language fluent, comprehension intact, naming intact fund of knowledge appropriate  CRANIAL NERVE:  2nd - no papilledema on fundoscopic exam 2nd, 3rd, 4th, 6th - pupils equal and reactive to light, visual fields full to confrontation, extraocular muscles intact, no nystagmus 5th - facial sensation symmetric 7th - facial strength symmetric 8th - hearing intact 9th - palate elevates symmetrically, uvula midline 11th - shoulder shrug symmetric 12th - tongue protrusion midline  MOTOR:  normal bulk and tone, full strength in the BUE, BLE  SENSORY:  normal and symmetric to light touch, temperature, vibration  COORDINATION:  finger-nose-finger, fine finger movements normal  REFLEXES:  deep tendon reflexes TRACE and  symmetric  GAIT/STATION:  narrow based gait     DIAGNOSTIC DATA (LABS, IMAGING, TESTING) - I reviewed patient records, labs, notes, testing and imaging myself where available.  Lab Results  Component Value Date   WBC 8.8 09/01/2011   HGB 13.5 09/01/2011   HCT 41.1 09/01/2011   MCV 90.1 09/01/2011   PLT 313 09/01/2011      Component Value Date/Time   NA 139 09/01/2011 1430   K 4.7 09/01/2011 1430   CL 103 09/01/2011 1430   CO2 30 09/01/2011 1430   GLUCOSE 91 09/01/2011 1430   BUN 12 09/01/2011 1430   CREATININE 0.57 09/01/2011 1430   CALCIUM 10.1 09/01/2011 1430   PROT 7.1 09/01/2011 1430   ALBUMIN 3.9 09/01/2011 1430   AST 15 09/01/2011 1430   ALT 16 09/01/2011 1430  ALKPHOS 97 09/01/2011 1430   BILITOT 0.5 09/01/2011 1430   GFRNONAA >90 09/01/2011 1430   GFRAA >90 09/01/2011 1430   No results found for: "CHOL", "HDL", "LDLCALC", "LDLDIRECT", "TRIG", "CHOLHDL" No results found for: "HGBA1C" No results found for: "VITAMINB12" No results found for: "TSH"     ASSESSMENT AND PLAN  67 y.o. year old female here with:   Dx:  1. Numbness and tingling of both feet     PLAN:  NUMBNESS / TINGLING / BURNING (progressive over past 6-12 months) - check EMG/NCS, neuropathy labs - trial of duloxetine 30mg  daily - consider MRI cervical spine  Orders Placed This Encounter  Procedures   Vitamin B12   A1c   TSH   SPEP with IFE   ANA w/Reflex   SSA, SSB   NCV with EMG(electromyography)   Meds ordered this encounter  Medications   DULoxetine (CYMBALTA) 30 MG capsule    Sig: Take 1 capsule (30 mg total) by mouth daily.    Dispense:  30 capsule    Refill:  6   Return for for NCV/EMG.    Penni Bombard, MD 5/57/3220, 25:42 AM Certified in Neurology, Neurophysiology and Neuroimaging  [facility] [address] [place], [place] [address] [phone]

## 2022-12-04 DIAGNOSIS — I25118 Atherosclerotic heart disease of native coronary artery with other forms of angina pectoris: Secondary | ICD-10-CM | POA: Diagnosis not present

## 2022-12-04 DIAGNOSIS — I10 Essential (primary) hypertension: Secondary | ICD-10-CM | POA: Diagnosis not present

## 2022-12-08 LAB — MULTIPLE MYELOMA PANEL, SERUM
Albumin SerPl Elph-Mcnc: 3.3 g/dL (ref 2.9–4.4)
Albumin/Glob SerPl: 1 (ref 0.7–1.7)
Alpha 1: 0.3 g/dL (ref 0.0–0.4)
Alpha2 Glob SerPl Elph-Mcnc: 0.9 g/dL (ref 0.4–1.0)
B-Globulin SerPl Elph-Mcnc: 1 g/dL (ref 0.7–1.3)
Gamma Glob SerPl Elph-Mcnc: 1.1 g/dL (ref 0.4–1.8)
Globulin, Total: 3.4 g/dL (ref 2.2–3.9)
IgA/Immunoglobulin A, Serum: 218 mg/dL (ref 87–352)
IgG (Immunoglobin G), Serum: 1270 mg/dL (ref 586–1602)
IgM (Immunoglobulin M), Srm: 40 mg/dL (ref 26–217)
Total Protein: 6.7 g/dL (ref 6.0–8.5)

## 2022-12-08 LAB — SJOGREN'S SYNDROME ANTIBODS(SSA + SSB)
ENA SSA (RO) Ab: <0.2 AI (ref 0.0–0.9)
ENA SSB (LA) Ab: <0.2 AI (ref 0.0–0.9)

## 2022-12-08 LAB — HEMOGLOBIN A1C
Est. average glucose Bld gHb Est-mCnc: 131 mg/dL
Hgb A1c MFr Bld: 6.2 % — ABNORMAL HIGH (ref 4.8–5.6)

## 2022-12-08 LAB — VITAMIN B12: Vitamin B-12: 278 pg/mL (ref 232–1245)

## 2022-12-08 LAB — ANA W/REFLEX: ANA Titer 1: NEGATIVE

## 2022-12-08 LAB — TSH: TSH: 1.15 u[IU]/mL (ref 0.450–4.500)

## 2023-01-08 ENCOUNTER — Ambulatory Visit: Payer: Medicare Other | Admitting: Neurology

## 2023-01-08 VITALS — BP 138/77 | HR 72 | Ht 65.0 in | Wt 190.0 lb

## 2023-01-08 DIAGNOSIS — R202 Paresthesia of skin: Secondary | ICD-10-CM | POA: Diagnosis not present

## 2023-01-08 DIAGNOSIS — R200 Anesthesia of skin: Secondary | ICD-10-CM | POA: Diagnosis not present

## 2023-01-08 DIAGNOSIS — R7303 Prediabetes: Secondary | ICD-10-CM | POA: Diagnosis not present

## 2023-01-08 DIAGNOSIS — E538 Deficiency of other specified B group vitamins: Secondary | ICD-10-CM | POA: Diagnosis not present

## 2023-01-08 NOTE — Patient Instructions (Signed)
Peripheral Neuropathy Peripheral neuropathy is a type of nerve damage. It affects nerves that carry signals between the spinal cord and the arms, legs, and the rest of the body (peripheral nerves). It does not affect nerves in the spinal cord or brain. In peripheral neuropathy, one nerve or a group of nerves may be damaged. Peripheral neuropathy is a broad category that includes many specific nerve disorders, like diabetic neuropathy, hereditary neuropathy, and carpal tunnel syndrome. What are the causes? This condition may be caused by: Certain diseases, such as: Diabetes. This is the most common cause of peripheral neuropathy. Autoimmune diseases, such as rheumatoid arthritis and systemic lupus erythematosus. Nerve diseases that are passed from parent to child (inherited). Kidney disease. Thyroid disease. Other causes may include: Nerve injury. Pressure or stress on a nerve that lasts a long time. Lack (deficiency) of B vitamins. This can result from alcoholism, poor diet, or a restricted diet. Infections. Some medicines, such as cancer medicines (chemotherapy). Poisonous (toxic) substances, such as lead and mercury. Too little blood flowing to the legs. In some cases, the cause of this condition is not known. What are the signs or symptoms? Symptoms of this condition depend on which of your nerves is damaged. Symptoms in the legs, hands, and arms can include: Loss of feeling (numbness) in the feet, hands, or both. Tingling in the feet, hands, or both. Burning pain. Very sensitive skin. Weakness. Not being able to move a part of the body (paralysis). Clumsiness or poor coordination. Muscle twitching. Loss of balance. Symptoms in other parts of the body can include: Not being able to control your bladder. Feeling dizzy. Sexual problems. How is this diagnosed? Diagnosing and finding the cause of peripheral neuropathy can be difficult. Your health care provider will take your  medical history and do a physical exam. A neurological exam will also be done. This involves checking things that are affected by your brain, spinal cord, and nerves (nervous system). For example, your health care provider will check your reflexes, how you move, and what you can feel. You may have other tests, such as: Blood tests. Electromyogram (EMG) and nerve conduction tests. These tests check nerve function and how well the nerves are controlling the muscles. Imaging tests, such as a CT scan or MRI, to rule out other causes of your symptoms. Removing a small piece of nerve to be examined in a lab (nerve biopsy). Removing and examining a small amount of the fluid that surrounds the brain and spinal cord (lumbar puncture). How is this treated? Treatment for this condition may involve: Treating the underlying cause of the neuropathy, such as diabetes, kidney disease, or vitamin deficiencies. Stopping medicines that can cause neuropathy, such as chemotherapy. Medicine to help relieve pain. Medicines may include: Prescription or over-the-counter pain medicine. Anti-seizure medicine. Antidepressants. Pain-relieving patches that are applied to painful areas of skin. Surgery to relieve pressure on a nerve or to destroy a nerve that is causing pain. Physical therapy to help improve movement and balance. Devices to help you move around (assistive devices). Follow these instructions at home: Medicines Take over-the-counter and prescription medicines only as told by your health care provider. Do not take any other medicines without first asking your health care provider. Ask your health care provider if the medicine prescribed to you requires you to avoid driving or using machinery. Lifestyle  Do not use any products that contain nicotine or tobacco. These products include cigarettes, chewing tobacco, and vaping devices, such as e-cigarettes. Smoking keeps   blood from reaching damaged nerves. If you  need help quitting, ask your health care provider. Avoid or limit alcohol. Too much alcohol can cause a vitamin B deficiency, and vitamin B is needed for healthy nerves. Eat a healthy diet. This includes: Eating foods that are high in fiber, such as beans, whole grains, and fresh fruits and vegetables. Limiting foods that are high in fat and processed sugars, such as fried or sweet foods. General instructions  If you have diabetes, work closely with your health care provider to keep your blood sugar under control. If you have numbness in your feet: Check every day for signs of injury or infection. Watch for redness, warmth, and swelling. Wear padded socks and comfortable shoes. These help protect your feet. Develop a good support system. Living with peripheral neuropathy can be stressful. Consider talking with a mental health specialist or joining a support group. Use assistive devices and attend physical therapy as told by your health care provider. This may include using a walker or a cane. Keep all follow-up visits. This is important. Where to find more information National Institute of Neurological Disorders: www.ninds.nih.gov Contact a health care provider if: You have new signs or symptoms of peripheral neuropathy. You are struggling emotionally from dealing with peripheral neuropathy. Your pain is not well controlled. Get help right away if: You have an injury or infection that is not healing normally. You develop new weakness in an arm or leg. You have fallen or do so frequently. Summary Peripheral neuropathy is when the nerves in the arms or legs are damaged, resulting in numbness, weakness, or pain. There are many causes of peripheral neuropathy, including diabetes, pinched nerves, vitamin deficiencies, autoimmune disease, and hereditary conditions. Diagnosing and finding the cause of peripheral neuropathy can be difficult. Your health care provider will take your medical  history, do a physical exam, and do tests, including blood tests and nerve function tests. Treatment involves treating the underlying cause of the neuropathy and taking medicines to help control pain. Physical therapy and assistive devices may also help. This information is not intended to replace advice given to you by your health care provider. Make sure you discuss any questions you have with your health care provider. Document Revised: 07/09/2021 Document Reviewed: 07/09/2021 Elsevier Patient Education  2023 [employer]  

## 2023-01-08 NOTE — Progress Notes (Signed)
67 y.o. female here as a referral for peripheral neuropathy.  She reports tingling in the toes and up to the ankle for about a year. Symmetrical. Worse at night. Started in the toes, sometimes on fire. Left is a little worse but overall symmetrical. No weakness.Discussed the causes of peripheral neuropathy with patient, the most common being diabetes or prediabetes. This is a condition that develops as a result of damage to the peripheral nervous system. 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 Also her b12 is low(278) will recheck with a methylmalonic acid.  Will recheck b12 with methylmalonic acid and discussed pre-diabetes. Follow up with primary care.   Orders Placed This Encounter  Procedures   B12 and Folate Panel   Methylmalonic acid, serum   I spent 15 minutes of face-to-face and non-face-to-face time with patient on the  1. Numbness and tingling of both feet   2. screen for B12 deficiency   3. Prediabetes    diagnosis.  This included previsit chart review, lab review, study review, order entry, electronic health record documentation, patient education on the different diagnostic and therapeutic options, counseling and coordination of care, risks and benefits of management, compliance, or risk factor reduction. This does not include time spent on emg/ncs.

## 2023-01-12 DIAGNOSIS — R200 Anesthesia of skin: Secondary | ICD-10-CM | POA: Insufficient documentation

## 2023-01-12 NOTE — Progress Notes (Signed)
Full Name: Janet Fields Gender: Female MRN #: [id number] Date of Birth: 1956/10/29    Visit Date: 01/08/2023 10:56 Age: 67 Years Examining Physician: Dr. Sarina Ill Referring Physician: Dr. Andrey Spearman Height: 5 feet 5 inch  History: Patient here for tingling in the toes and up to the ankle for about a year. Symmetrical. Worse at night. Started in the toes, sometimes on fire. Left is a little worse but overall symmetrical. No weakness. Patient is pre-diabetic and has a low B12 on recent testing(278) which may cause a small-fiber neuropathy.  Summary: EMG/NCS was performed on the lower extremities. All nerves and muscles (as indicated in the following tables) were within normal limits.       Conclusion: This is a normal study. No suggestion of large-fiber polyneuropathy, mononeuropathy or lumbar radiculopathy. A small-fiber neuropathy could evade detection by this examination.  Patient is pre-diabetic and has a low B12 on recent testing(278) which may both be causes for a small-fiber neuropathy.    ------------------------------- Sarina Ill, M.D.   [facility] [address], [place], [place] [address] Tel: [phone] Fax: [phone]  Verbal informed consent was obtained from the patient, patient was informed of potential risk of procedure, including bruising, bleeding, hematoma formation, infection, muscle weakness, muscle pain, numbness, among others.        [place]    Nerve / Sites Muscle Latency Ref. Amplitude Ref. Rel Amp Segments Distance Velocity Ref. Area    ms ms mV mV %  cm m/s m/s mVms  L Peroneal - EDB     Ankle EDB 6.4 ?6.5 3.0 ?2.0 100 Ankle - EDB 9   11.1     Fib head EDB 11.0  2.4  80.4 Fib head - Ankle 20 44 ?44 8.2     Pop fossa EDB 13.7  2.7  115 Pop fossa - Fib head 13.5 49 ?44 11.3         Pop fossa - Ankle      L Tibial - AH     Ankle AH 5.0 ?5.8 4.1 ?4.0 100 Ankle - AH 9   12.5     Pop fossa AH 13.3  3.5  86.4  Pop fossa - Ankle 37.5 45 ?41 12.1  R Tibial - AH     Ankle AH 4.5 ?5.8 8.4 ?4.0 100 Ankle - AH 9   16.2           SNC    Nerve / Sites Rec. Site Peak Lat Ref.  Amp Ref. Segments Distance    ms ms V V  cm  L Sural - Ankle (Calf)     Calf Ankle 2.8 ?4.4 11 ?6 Calf - Ankle 14  L Superficial peroneal - Ankle     Lat leg Ankle 2.3 ?4.4 7 ?6 Lat leg - Ankle 14         F  Wave    Nerve F Lat Ref.   ms ms  L Tibial - AH 51.3 ?56.0       H Reflex    Nerve H Lat Lat Hmax   ms ms   Left Right Ref. Left Right Ref.  Tibial - [place]oleus 24.4 26.0 ?35.0 24.3 25.5 ?35.0         EMG Summary Table    Spontaneous MUAP Recruitment  Muscle IA Fib PSW Fasc Other Amp Dur. Poly Pattern  R. Vastus medialis Normal None None None _______ Normal Normal Normal Normal  R. Tibialis anterior Normal None None None _______ Normal Normal Normal Normal  R. Gastrocnemius (Medial head) Normal None None None _______ Normal Normal Normal Normal  R. Abductor hallucis Normal None None None _______ Normal Normal Normal Normal  R. Extensor hallucis longus Normal None None None _______ Normal Normal Normal Normal

## 2023-01-12 NOTE — Procedures (Signed)
Full Name: Janet Fields Gender: Female MRN #: [id number] Date of Birth: 1955/12/18    Visit Date: 01/08/2023 10:56 Age: 67 Years Examining Physician: Dr. Sarina Ill Referring Physician: Dr. Andrey Spearman Height: 5 feet 5 inch  History: Patient here for tingling in the toes and up to the ankle for about a year. Symmetrical. Worse at night. Started in the toes, sometimes on fire. Left is a little worse but overall symmetrical. No weakness. Patient is pre-diabetic and has a low B12 on recent testing(278) which may cause a small-fiber neuropathy.  Summary: EMG/NCS was performed on the lower extremities. All nerves and muscles (as indicated in the following tables) were within normal limits.       Conclusion: This is a normal study. No suggestion of large-fiber polyneuropathy, mononeuropathy or lumbar radiculopathy. A small-fiber neuropathy could evade detection by this examination.     ------------------------------- Sarina Ill, M.D.   [facility] [address] [place], [place] [address] Tel: [phone] Fax: [phone]  Verbal informed consent was obtained from the patient, patient was informed of potential risk of procedure, including bruising, bleeding, hematoma formation, infection, muscle weakness, muscle pain, numbness, among others.        [place]    Nerve / Sites Muscle Latency Ref. Amplitude Ref. Rel Amp Segments Distance Velocity Ref. Area    ms ms mV mV %  cm m/s m/s mVms  L Peroneal - EDB     Ankle EDB 6.4 ?6.5 3.0 ?2.0 100 Ankle - EDB 9   11.1     Fib head EDB 11.0  2.4  80.4 Fib head - Ankle 20 44 ?44 8.2     Pop fossa EDB 13.7  2.7  115 Pop fossa - Fib head 13.5 49 ?44 11.3         Pop fossa - Ankle      L Tibial - AH     Ankle AH 5.0 ?5.8 4.1 ?4.0 100 Ankle - AH 9   12.5     Pop fossa AH 13.3  3.5  86.4 Pop fossa - Ankle 37.5 45 ?41 12.1  R Tibial - AH     Ankle AH 4.5 ?5.8 8.4 ?4.0 100 Ankle - AH 9   16.2            SNC    Nerve / Sites Rec. Site Peak Lat Ref.  Amp Ref. Segments Distance    ms ms V V  cm  L Sural - Ankle (Calf)     Calf Ankle 2.8 ?4.4 11 ?6 Calf - Ankle 14  L Superficial peroneal - Ankle     Lat leg Ankle 2.3 ?4.4 7 ?6 Lat leg - Ankle 14         F  Wave    Nerve F Lat Ref.   ms ms  L Tibial - AH 51.3 ?56.0       H Reflex    Nerve H Lat Lat Hmax   ms ms   Left Right Ref. Left Right Ref.  Tibial - Soleus 24.4 26.0 ?35.0 24.3 25.5 ?35.0         EMG Summary Table    Spontaneous MUAP Recruitment  Muscle IA Fib PSW Fasc Other Amp Dur. Poly Pattern  R. Vastus medialis Normal None None None _______ Normal Normal Normal Normal  R. Tibialis anterior Normal None None None _______ Normal Normal Normal Normal  R. Gastrocnemius (Medial head) Normal None  None None _______ Normal Normal Normal Normal  R. Abductor hallucis Normal None None None _______ Normal Normal Normal Normal  R. Extensor hallucis longus Normal None None None _______ Normal Normal Normal Normal

## 2023-01-13 LAB — METHYLMALONIC ACID, SERUM: Methylmalonic Acid: 118 nmol/L (ref 0–378)

## 2023-01-13 LAB — B12 AND FOLATE PANEL
Folate: >20 ng/mL (ref >3.0)
Vitamin B-12: 304 pg/mL (ref 232–1245)

## 2023-03-10 DIAGNOSIS — A084 Viral intestinal infection, unspecified: Secondary | ICD-10-CM | POA: Diagnosis not present

## 2023-03-10 DIAGNOSIS — Z6834 Body mass index (BMI) 34.0-34.9, adult: Secondary | ICD-10-CM | POA: Diagnosis not present

## 2023-03-10 DIAGNOSIS — R030 Elevated blood-pressure reading, without diagnosis of hypertension: Secondary | ICD-10-CM | POA: Diagnosis not present

## 2023-10-07 ENCOUNTER — Other Ambulatory Visit (HOSPITAL_COMMUNITY): Payer: Self-pay | Admitting: Internal Medicine

## 2023-10-07 DIAGNOSIS — Z1231 Encounter for screening mammogram for malignant neoplasm of breast: Secondary | ICD-10-CM

## 2023-10-19 DIAGNOSIS — Z0001 Encounter for general adult medical examination with abnormal findings: Secondary | ICD-10-CM | POA: Diagnosis not present

## 2023-10-19 DIAGNOSIS — I10 Essential (primary) hypertension: Secondary | ICD-10-CM | POA: Diagnosis not present

## 2023-10-19 DIAGNOSIS — Z131 Encounter for screening for diabetes mellitus: Secondary | ICD-10-CM | POA: Diagnosis not present

## 2023-10-19 DIAGNOSIS — Z1329 Encounter for screening for other suspected endocrine disorder: Secondary | ICD-10-CM | POA: Diagnosis not present

## 2023-10-19 DIAGNOSIS — E7849 Other hyperlipidemia: Secondary | ICD-10-CM | POA: Diagnosis not present

## 2023-10-19 DIAGNOSIS — E559 Vitamin D deficiency, unspecified: Secondary | ICD-10-CM | POA: Diagnosis not present

## 2023-10-21 ENCOUNTER — Ambulatory Visit (HOSPITAL_COMMUNITY)
Admission: RE | Admit: 2023-10-21 | Discharge: 2023-10-21 | Disposition: A | Discharge status: Home / Self Care | Payer: HMO | Source: Ambulatory Visit | Attending: Internal Medicine | Admitting: Internal Medicine

## 2023-10-21 DIAGNOSIS — Z1231 Encounter for screening mammogram for malignant neoplasm of breast: Secondary | ICD-10-CM | POA: Insufficient documentation

## 2023-10-29 DIAGNOSIS — K219 Gastro-esophageal reflux disease without esophagitis: Secondary | ICD-10-CM | POA: Diagnosis not present

## 2023-10-29 DIAGNOSIS — Z0001 Encounter for general adult medical examination with abnormal findings: Secondary | ICD-10-CM | POA: Diagnosis not present

## 2023-10-29 DIAGNOSIS — R7303 Prediabetes: Secondary | ICD-10-CM | POA: Diagnosis not present

## 2023-10-29 DIAGNOSIS — I10 Essential (primary) hypertension: Secondary | ICD-10-CM | POA: Diagnosis not present

## 2023-10-29 DIAGNOSIS — M79606 Pain in leg, unspecified: Secondary | ICD-10-CM | POA: Diagnosis not present

## 2023-10-29 DIAGNOSIS — Z23 Encounter for immunization: Secondary | ICD-10-CM | POA: Diagnosis not present

## 2023-10-29 DIAGNOSIS — G4700 Insomnia, unspecified: Secondary | ICD-10-CM | POA: Diagnosis not present

## 2023-10-29 DIAGNOSIS — Z6839 Body mass index (BMI) 39.0-39.9, adult: Secondary | ICD-10-CM | POA: Diagnosis not present

## 2023-12-08 DIAGNOSIS — I872 Venous insufficiency (chronic) (peripheral): Secondary | ICD-10-CM | POA: Diagnosis not present

## 2023-12-08 DIAGNOSIS — I10 Essential (primary) hypertension: Secondary | ICD-10-CM | POA: Diagnosis not present

## 2023-12-08 DIAGNOSIS — I2510 Atherosclerotic heart disease of native coronary artery without angina pectoris: Secondary | ICD-10-CM | POA: Diagnosis not present

## 2023-12-08 DIAGNOSIS — R0989 Other specified symptoms and signs involving the circulatory and respiratory systems: Secondary | ICD-10-CM | POA: Diagnosis not present

## 2024-02-17 DIAGNOSIS — R7303 Prediabetes: Secondary | ICD-10-CM | POA: Diagnosis not present

## 2024-02-17 DIAGNOSIS — Z6838 Body mass index (BMI) 38.0-38.9, adult: Secondary | ICD-10-CM | POA: Diagnosis not present

## 2024-02-17 DIAGNOSIS — E7800 Pure hypercholesterolemia, unspecified: Secondary | ICD-10-CM | POA: Diagnosis not present

## 2024-02-17 DIAGNOSIS — E7801 Familial hypercholesterolemia: Secondary | ICD-10-CM | POA: Diagnosis not present

## 2024-02-17 DIAGNOSIS — G4700 Insomnia, unspecified: Secondary | ICD-10-CM | POA: Diagnosis not present

## 2024-02-17 DIAGNOSIS — I10 Essential (primary) hypertension: Secondary | ICD-10-CM | POA: Diagnosis not present

## 2024-05-06 DIAGNOSIS — I959 Hypotension, unspecified: Secondary | ICD-10-CM | POA: Diagnosis not present

## 2024-05-06 DIAGNOSIS — Z6838 Body mass index (BMI) 38.0-38.9, adult: Secondary | ICD-10-CM | POA: Diagnosis not present

## 2024-05-10 DIAGNOSIS — Z1331 Encounter for screening for depression: Secondary | ICD-10-CM | POA: Diagnosis not present

## 2024-05-10 DIAGNOSIS — Z6839 Body mass index (BMI) 39.0-39.9, adult: Secondary | ICD-10-CM | POA: Diagnosis not present

## 2024-05-10 DIAGNOSIS — Z1389 Encounter for screening for other disorder: Secondary | ICD-10-CM | POA: Diagnosis not present

## 2024-05-10 DIAGNOSIS — Z0001 Encounter for general adult medical examination with abnormal findings: Secondary | ICD-10-CM | POA: Diagnosis not present

## 2024-05-10 DIAGNOSIS — I959 Hypotension, unspecified: Secondary | ICD-10-CM | POA: Diagnosis not present

## 2024-06-02 DIAGNOSIS — R7303 Prediabetes: Secondary | ICD-10-CM | POA: Diagnosis not present

## 2024-06-02 DIAGNOSIS — I10 Essential (primary) hypertension: Secondary | ICD-10-CM | POA: Diagnosis not present

## 2024-06-02 DIAGNOSIS — I959 Hypotension, unspecified: Secondary | ICD-10-CM | POA: Diagnosis not present

## 2024-06-02 DIAGNOSIS — E7800 Pure hypercholesterolemia, unspecified: Secondary | ICD-10-CM | POA: Diagnosis not present

## 2024-06-09 DIAGNOSIS — I10 Essential (primary) hypertension: Secondary | ICD-10-CM | POA: Diagnosis not present

## 2024-06-09 DIAGNOSIS — Z6841 Body Mass Index (BMI) 40.0 and over, adult: Secondary | ICD-10-CM | POA: Diagnosis not present

## 2024-06-09 DIAGNOSIS — I959 Hypotension, unspecified: Secondary | ICD-10-CM | POA: Diagnosis not present

## 2024-06-09 DIAGNOSIS — E7800 Pure hypercholesterolemia, unspecified: Secondary | ICD-10-CM | POA: Diagnosis not present

## 2024-06-09 DIAGNOSIS — G4700 Insomnia, unspecified: Secondary | ICD-10-CM | POA: Diagnosis not present

## 2024-09-13 DIAGNOSIS — J209 Acute bronchitis, unspecified: Secondary | ICD-10-CM | POA: Diagnosis not present

## 2024-09-13 DIAGNOSIS — Z6841 Body Mass Index (BMI) 40.0 and over, adult: Secondary | ICD-10-CM | POA: Diagnosis not present

## 2024-10-04 ENCOUNTER — Other Ambulatory Visit (HOSPITAL_COMMUNITY): Payer: Self-pay | Admitting: Internal Medicine

## 2024-10-04 DIAGNOSIS — Z1231 Encounter for screening mammogram for malignant neoplasm of breast: Secondary | ICD-10-CM

## 2024-10-19 ENCOUNTER — Other Ambulatory Visit: Payer: Self-pay | Admitting: Medical Genetics

## 2024-10-26 ENCOUNTER — Ambulatory Visit (HOSPITAL_COMMUNITY)
Admission: RE | Admit: 2024-10-26 | Discharge: 2024-10-26 | Disposition: A | Discharge status: Home / Self Care | Source: Ambulatory Visit | Attending: Internal Medicine | Admitting: Internal Medicine

## 2024-10-26 ENCOUNTER — Inpatient Hospital Stay (HOSPITAL_COMMUNITY): Admission: RE | Admit: 2024-10-26 | Discharge: 2024-10-26 | Attending: Internal Medicine | Admitting: Internal Medicine

## 2024-10-26 ENCOUNTER — Encounter (HOSPITAL_COMMUNITY): Payer: Self-pay

## 2024-10-26 DIAGNOSIS — Z1231 Encounter for screening mammogram for malignant neoplasm of breast: Secondary | ICD-10-CM | POA: Insufficient documentation

## 2024-11-06 LAB — GENECONNECT MOLECULAR SCREEN: Genetic Analysis Overall Interpretation: NEGATIVE
